# Patient Record
Sex: Female | Born: 1950 | Race: White | Hispanic: No | Marital: Married | Smoking: Current every day smoker
Health system: Southern US, Community
[De-identification: ages and names within clinical notes are randomized; demographics above are authoritative.]

## PROBLEM LIST (undated history)

## (undated) DIAGNOSIS — F32A Depression, unspecified: Secondary | ICD-10-CM

## (undated) DIAGNOSIS — F419 Anxiety disorder, unspecified: Secondary | ICD-10-CM

## (undated) DIAGNOSIS — F410 Panic disorder [episodic paroxysmal anxiety] without agoraphobia: Secondary | ICD-10-CM

## (undated) DIAGNOSIS — F329 Major depressive disorder, single episode, unspecified: Secondary | ICD-10-CM

## (undated) DIAGNOSIS — K219 Gastro-esophageal reflux disease without esophagitis: Secondary | ICD-10-CM

## (undated) DIAGNOSIS — E079 Disorder of thyroid, unspecified: Secondary | ICD-10-CM

## (undated) DIAGNOSIS — I10 Essential (primary) hypertension: Secondary | ICD-10-CM

## (undated) DIAGNOSIS — T7840XA Allergy, unspecified, initial encounter: Secondary | ICD-10-CM

## (undated) HISTORY — DX: Gastro-esophageal reflux disease without esophagitis: K21.9

## (undated) HISTORY — PX: COLONOSCOPY: SHX174

## (undated) HISTORY — PX: CATARACT EXTRACTION, BILATERAL: SHX1313

## (undated) HISTORY — DX: Allergy, unspecified, initial encounter: T78.40XA

## (undated) HISTORY — DX: Panic disorder (episodic paroxysmal anxiety): F41.0

## (undated) HISTORY — DX: Depression, unspecified: F32.A

## (undated) HISTORY — DX: Essential (primary) hypertension: I10

## (undated) HISTORY — DX: Disorder of thyroid, unspecified: E07.9

## (undated) HISTORY — DX: Anxiety disorder, unspecified: F41.9

## (undated) HISTORY — DX: Major depressive disorder, single episode, unspecified: F32.9

---

## 1981-01-06 HISTORY — PX: TUBAL LIGATION: SHX77

## 2004-11-27 ENCOUNTER — Ambulatory Visit: Payer: Self-pay | Admitting: Unknown Physician Specialty

## 2007-04-07 LAB — CONVERTED CEMR LAB: Pap Smear: NORMAL

## 2008-08-30 ENCOUNTER — Ambulatory Visit: Payer: Self-pay | Admitting: Family Medicine

## 2008-08-30 DIAGNOSIS — J309 Allergic rhinitis, unspecified: Secondary | ICD-10-CM | POA: Insufficient documentation

## 2008-08-30 DIAGNOSIS — E039 Hypothyroidism, unspecified: Secondary | ICD-10-CM

## 2008-08-30 DIAGNOSIS — F17200 Nicotine dependence, unspecified, uncomplicated: Secondary | ICD-10-CM

## 2008-09-27 ENCOUNTER — Ambulatory Visit: Payer: Self-pay | Admitting: Family Medicine

## 2008-09-27 LAB — CONVERTED CEMR LAB
BUN: 7 mg/dL (ref 6–23)
Basophils Absolute: 0 10*3/uL (ref 0.0–0.1)
Bilirubin, Direct: 0.1 mg/dL (ref 0.0–0.3)
Chloride: 108 meq/L (ref 96–112)
Creatinine, Ser: 0.7 mg/dL (ref 0.4–1.2)
Eosinophils Absolute: 0.4 10*3/uL (ref 0.0–0.7)
Eosinophils Relative: 6.3 % — ABNORMAL HIGH (ref 0.0–5.0)
HDL: 53.4 mg/dL (ref >39.00)
LDL Cholesterol: 97 mg/dL (ref 0–99)
MCHC: 34.5 g/dL (ref 30.0–36.0)
MCV: 94.8 fL (ref 78.0–100.0)
Monocytes Absolute: 0.4 10*3/uL (ref 0.1–1.0)
Neutrophils Relative %: 57.5 % (ref 43.0–77.0)
Platelets: 228 10*3/uL (ref 150.0–400.0)
RDW: 12.5 % (ref 11.5–14.6)
Total Bilirubin: 1.1 mg/dL (ref 0.3–1.2)
Total CHOL/HDL Ratio: 3
Triglycerides: 115 mg/dL (ref 0.0–149.0)
VLDL: 23 mg/dL (ref 0.0–40.0)
WBC: 6 10*3/uL (ref 4.5–10.5)

## 2008-09-29 ENCOUNTER — Ambulatory Visit: Payer: Self-pay | Admitting: Family Medicine

## 2008-10-16 ENCOUNTER — Ambulatory Visit: Payer: Self-pay | Admitting: Family Medicine

## 2008-10-16 DIAGNOSIS — J0110 Acute frontal sinusitis, unspecified: Secondary | ICD-10-CM | POA: Insufficient documentation

## 2008-10-23 ENCOUNTER — Encounter: Payer: Self-pay | Admitting: Family Medicine

## 2008-11-08 ENCOUNTER — Encounter (INDEPENDENT_AMBULATORY_CARE_PROVIDER_SITE_OTHER): Payer: Self-pay | Admitting: *Deleted

## 2009-02-21 ENCOUNTER — Ambulatory Visit: Payer: Self-pay | Admitting: Family Medicine

## 2009-03-21 ENCOUNTER — Ambulatory Visit: Payer: Self-pay | Admitting: Family Medicine

## 2009-03-21 DIAGNOSIS — R5381 Other malaise: Secondary | ICD-10-CM

## 2009-03-21 DIAGNOSIS — R5383 Other fatigue: Secondary | ICD-10-CM

## 2009-07-26 ENCOUNTER — Telehealth: Payer: Self-pay | Admitting: Family Medicine

## 2010-02-05 NOTE — Progress Notes (Signed)
Summary: synthroid  Phone Note Refill Request Call back at Home Phone [phone] Message from:  Patient on July 26, 2009 10:15 AM  Refills Requested: Medication #1:  SYNTHROID 100 MCG TABS Take 1 tablet by mouth once a day [facility] on [address]. Patient says that she has contacted the pharmacy and they told her they sent [place] the request.  Initial call taken by: Melody Comas,  July 26, 2009 10:14 AM    Prescriptions: SYNTHROID 100 MCG TABS (LEVOTHYROXINE SODIUM) Take 1 tablet by mouth once a day  #30 x 3   Entered by:   Lowella Petties CMA   Authorized by:   Kerby Nora MD   Signed by:   Lowella Petties CMA on 07/26/2009   Method used:   Electronically to        [facility] [phone].* (retail)       [address]       [place], [place]  [address]       Ph: [phone]       Fax: [phone]   RxID:   [id number]

## 2010-02-05 NOTE — Assessment & Plan Note (Signed)
Summary: ROA FOR FOLLOW-UP WELLBUTRIN/JRR   Vital Signs:  Patient profile:   60 year old female Height:      64.75 inches Weight:      162.0 pounds BMI:     27.26 Temp:     98.2 degrees F oral Pulse rate:   86 / minute Pulse rhythm:   regular BP sitting:   120 / 72  (left arm) Cuff size:   regular  Vitals Entered By: Benny Lennert CMA Duncan Dull) (March 21, 2009 2:28 PM)  History of Present Illness: Chief complaint follow up medication  Smoking cessation.Marland Kitchendoing well on Zyban..has cut down to 5 per day. Only using Zyban once daily ..plans to increase to two times a day. Has started walking more..more energetic, less stressed out, less fatigue.  HAs been on for 1 month.  No SI, no HI.    Problems Prior to Update: 1)  Acute Frontal Sinusitis  (ICD-461.1) 2)  Preventive Health Care  (ICD-V70.0) 3)  Special Screening For Osteoporosis  (ICD-V82.81) 4)  Other Screening Mammogram  (ICD-V76.12) 5)  Screening For Lipoid Disorders  (ICD-V77.91) 6)  Tobacco Abuse  (ICD-305.1) 7)  Hypothyroidism  (ICD-244.9) 8)  Allergic Rhinitis  (ICD-477.9)  Current Medications (verified): 1)  Synthroid 100 Mcg Tabs (Levothyroxine Sodium) .... Take 1 Tablet By Mouth Once A Day 2)  Aspirin 81 Mg  Tabs (Aspirin) .... Take 1 Tablet By Mouth Once A Day 3)  Multivitamins   Tabs (Multiple Vitamin) .... Sometimes 4)  Zyrtec Allergy 10 Mg Caps (Cetirizine Hcl) .... Take 1 Capsule By Mouth At Bedtime As Needed 5)  Bupropion Hcl 150 Mg Xr24h-Tab (Bupropion Hcl) .Marland Kitchen.. 1 Tab By Mouth Daily  Allergies (verified): 1)  ! Codeine  Past History:  Past medical, surgical, family and social histories (including risk factors) reviewed, and no changes noted (except as noted below).  Past Medical History: Reviewed history from 08/30/2008 and no changes required. Allergic rhinitis Hypothyroidism  Past Surgical History: Reviewed history from 08/30/2008 and no changes required. Appendectomy BTL 1983  Family  History: Reviewed history from 08/30/2008 and no changes required. father: alcoholism, suicide iage 7 mother: COPD brother: healthy MGM: Mi age 60 PGF: ? cancer type  Social History: Reviewed history from 08/30/2008 and no changes required. 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 CV:  Denies chest pain or discomfort. Resp:  Denies shortness of breath. GI:  Denies abdominal pain, bloody stools, constipation, and diarrhea. Psych:  Denies anxiety and depression.  Physical Exam  General:  Well-developed,well-nourished,in no acute distress; alert,appropriate and cooperative throughout examination Mouth:  Oral mucosa and oropharynx without lesions or exudates.  Teeth in good repair. Lungs:  Normal respiratory effort, chest expands symmetrically. Lungs are clear to auscultation, no crackles or wheezes. Heart:  Normal rate and regular rhythm. S1 and S2 normal without gallop, murmur, click, rub or other extra sounds. Abdomen:  Bowel sounds positive,abdomen soft and non-tender without masses, organomegaly or hernias noted. Pulses:  R and L posterior tibial pulses are full and equal bilaterally  Extremities:  no edema  Psych:  Cognition and judgment appear intact. Alert and cooperative with normal attention span and concentration. No apparent delusions, illusions, hallucinations   Impression & Recommendations:  Problem # 1:  MALAISE AND FATIGUE (ICD-780.79) Improved once  on wellbutrin. Refilled medicaiton.  Exercsiing, working on healthy eating.   Problem # 2:  TOBACCO ABUSE (ICD-305.1) Recommended quiting smoking entirely..she has planned stop date. Refilled medicaiton..with once a day generic as changed by pharmacy.   Complete  Medication List: 1)  Synthroid 100 Mcg Tabs (Levothyroxine sodium) .... Take 1 tablet by mouth once a day 2)  Aspirin 81 Mg Tabs (Aspirin) .... Take 1 tablet by mouth once a day 3)  Multivitamins Tabs (Multiple vitamin) .... Sometimes 4)  Zyrtec Allergy 10 Mg Caps (Cetirizine hcl) .... Take 1 capsule by mouth at bedtime as needed 5)  Bupropion Hcl 150 Mg Xr24h-tab (Bupropion hcl) .Marland Kitchen.. 1 tab by mouth daily  Patient Instructions: 1)  Follow up as needed.  Prescriptions: BUPROPION HCL 150 MG XR24H-TAB (BUPROPION HCL) 1 tab by mouth daily  #30 x 5   Entered and Authorized by:   Kerby Nora MD   Signed by:   Kerby Nora MD on 03/21/2009   Method used:   Electronically to        [facility] [address] [phone].* (retail)       [address]       [place], [place]  [address]       Ph: [phone]       Fax: [phone]   RxID:   [id number]

## 2010-02-05 NOTE — Assessment & Plan Note (Signed)
Summary: SINUS INFECTION/RBH   Vital Signs:  Patient profile:   60 year old female Height:      64.75 inches Weight:      162 pounds BMI:     27.26 Temp:     98.4 degrees F oral Pulse rate:   92 / minute Pulse rhythm:   regular BP sitting:   134 / 74  (left arm) Cuff size:   regular  Vitals Entered By: Delilah Shan CMA Duncan Dull) (February 21, 2009 3:17 PM) CC: ? sinus infection   History of Present Illness: 60 yo here for acute visit to discuss URI symptoms and smoking cessation.  URI symptoms- two weeks of worsening nasal congestion, sinus pressure. Subjective fevers and chills.  Dry cough. No shortness of breath or wheezing. No n/v/d, no rashes. Not taking anything OTC.  Tobacco abuse- ready to quit.  Has a 10 year pack history.  Quit once before with patches, then started back again 4 months later.  Watched her mom die of COPD and does not want that for herself.  Tried Chantix but it made her feel "swimmy headed."  Current Medications (verified): 1)  Synthroid 100 Mcg Tabs (Levothyroxine Sodium) .... Take 1 Tablet By Mouth Once A Day 2)  Aspirin 81 Mg  Tabs (Aspirin) .... Take 1 Tablet By Mouth Once A Day 3)  Multivitamins   Tabs (Multiple Vitamin) .... Sometimes 4)  Zyrtec Allergy 10 Mg Caps (Cetirizine Hcl) .... Take 1 Capsule By Mouth At Bedtime As Needed 5)  Azithromycin 250 Mg  Tabs (Azithromycin) .... 2 By  Mouth Today and Then 1 Daily For 4 Days 6)  Zyban 150 Mg Xr12h-Tab (Bupropion Hcl (Smoking Deter)) .Marland Kitchen.. 1 Tab By Mouth Daily For 3 Days; Increase To 1 Tab By Mouth Twice Daily; Treatment Should Continue For 7-12 Weeks.  Allergies: 1)  ! Codeine  Review of Systems      See HPI General:  Complains of chills and fever. ENT:  Complains of nasal congestion, postnasal drainage, and sinus pressure. CV:  Denies chest pain or discomfort. Resp:  Complains of cough; denies shortness of breath, sputum productive, and wheezing.  Physical Exam  General:   Well-developed,well-nourished,in no acute distress; alert,appropriate and cooperative throughout examination Afebrile and nontoxic. Ears:  TMs retracted bilaterally. Nose:  nasal dischargemucosal pallor.   Mouth:  MMM, no tongue abnormalities and no leukoplakia.   Lungs:  Normal respiratory effort, chest expands symmetrically. Lungs are clear to auscultation, no crackles or wheezes. Heart:  Normal rate and regular rhythm. S1 and S2 normal without gallop, murmur, click, rub or other extra sounds. Extremities:  no edema Cervical Nodes:  No lymphadenopathy noted Psych:  Cognition and judgment appear intact. Alert and cooperative with normal attention span and concentration. No apparent delusions, illusions, hallucinations   Impression & Recommendations:  Problem # 1:  ACUTE FRONTAL SINUSITIS (ICD-461.1) Assessment New Given duration and progression of symptoms, will treat for bacterial sinusitis with Zpack. Continue supportive care as per pt instructions. The following medications were removed from the medication list:    Azithromycin 500 Mg Tabs (Azithromycin) .Marland Kitchen... 1 tab by mouth daily x 3 days. Her updated medication list for this problem includes:    Azithromycin 250 Mg Tabs (Azithromycin) .Marland Kitchen... 2 by  mouth today and then 1 daily for 4 days  Problem # 2:  TOBACCO ABUSE (ICD-305.1) Assessment: Unchanged Time spent with patient 25 minutes, more than 50% of this time was spent counseling patient on smoking cessation. Will try  Zyban with patches as the two in combination have been shown to be as equally efficacious as Chantix.  F/u in one month.  Her updated medication list for this problem includes:    Zyban 150 Mg Xr12h-tab (Bupropion hcl (smoking deter)) .Marland Kitchen... 1 tab by mouth daily for 3 days; increase to 1 tab by mouth twice daily; treatment should continue for 7-12 weeks.  Complete Medication List: 1)  Synthroid 100 Mcg Tabs (Levothyroxine sodium) .... Take 1 tablet by mouth once a  day 2)  Aspirin 81 Mg Tabs (Aspirin) .... Take 1 tablet by mouth once a day 3)  Multivitamins Tabs (Multiple vitamin) .... Sometimes 4)  Zyrtec Allergy 10 Mg Caps (Cetirizine hcl) .... Take 1 capsule by mouth at bedtime as needed 5)  Azithromycin 250 Mg Tabs (Azithromycin) .... 2 by  mouth today and then 1 daily for 4 days 6)  Zyban 150 Mg Xr12h-tab (Bupropion hcl (smoking deter)) .Marland Kitchen.. 1 tab by mouth daily for 3 days; increase to 1 tab by mouth twice daily; treatment should continue for 7-12 weeks.  Patient Instructions: 1)  Take Zpack as directed.  Drink lots of fluids.  Treat sympotmatically with Mucinex, nasal saline irrigation, and Tylenol/Ibuprofen.  You can use warm compresses.  Call if not improving as expected in 5-7 days.  2)  Stop smoking tips: Choose a quit date. Start Zyban (Wellbutrin) at least 1 week before your quit date.   Decide what you will do as a substitute when you feel the urge to smoke(gum, toothpick, exercise).  3)  Please follow up with me in one month. Prescriptions: ZYBAN 150 MG XR12H-TAB (BUPROPION HCL (SMOKING DETER)) 1 tab by mouth daily for 3 days; increase to 1 tab by mouth twice daily; treatment should continue for 7-12 weeks.  #60 x 0   Entered and Authorized by:   Ruthe Mannan MD   Signed by:   Ruthe Mannan MD on 02/21/2009   Method used:   Electronically to        [facility] [address] [phone].* (retail)       [address]       [place], [place]  [address]       Ph: [phone]       Fax: [phone]   RxID:   [id number] AZITHROMYCIN 250 MG  TABS (AZITHROMYCIN) 2 by  mouth today and then 1 daily for 4 days  #6 x 0   Entered and Authorized by:   Ruthe Mannan MD   Signed by:   Ruthe Mannan MD on 02/21/2009   Method used:   Electronically to        [facility] [address] [phone].* (retail)       [address]       [place], [place]  [address]       Ph: [phone]       Fax: [phone]   RxID:    [id number]   Current Allergies (reviewed today): ! CODEINE

## 2010-05-04 ENCOUNTER — Other Ambulatory Visit: Payer: Self-pay | Admitting: Family Medicine

## 2012-04-20 HISTORY — PX: APPENDECTOMY: SHX54

## 2012-06-03 ENCOUNTER — Other Ambulatory Visit: Payer: Self-pay | Admitting: Gastroenterology

## 2012-06-04 LAB — WBCS, STOOL

## 2012-08-15 ENCOUNTER — Emergency Department: Payer: Self-pay | Admitting: Emergency Medicine

## 2012-08-15 LAB — COMPREHENSIVE METABOLIC PANEL
Anion Gap: 8 (ref 7–16)
BUN: 9 mg/dL (ref 7–18)
Bilirubin,Total: 0.6 mg/dL (ref 0.2–1.0)
Calcium, Total: 8.9 mg/dL (ref 8.5–10.1)
Chloride: 99 mmol/L (ref 98–107)
Co2: 27 mmol/L (ref 21–32)
EGFR (African American): >60
EGFR (Non-African Amer.): >60
Glucose: 92 mg/dL (ref 65–99)
Potassium: 3.9 mmol/L (ref 3.5–5.1)
SGOT(AST): 26 U/L (ref 15–37)
SGPT (ALT): 25 U/L (ref 12–78)
Sodium: 134 mmol/L — ABNORMAL LOW (ref 136–145)
Total Protein: 7.6 g/dL (ref 6.4–8.2)

## 2012-08-15 LAB — CBC
HCT: 39.9 % (ref 35.0–47.0)
HGB: 14.1 g/dL (ref 12.0–16.0)
MCH: 33.7 pg (ref 26.0–34.0)
MCHC: 35.4 g/dL (ref 32.0–36.0)
MCV: 95 fL (ref 80–100)
Platelet: 272 10*3/uL (ref 150–440)
RBC: 4.2 10*6/uL (ref 3.80–5.20)

## 2012-08-15 LAB — TSH: Thyroid Stimulating Horm: 0.719 u[IU]/mL

## 2012-08-15 LAB — URINALYSIS, COMPLETE
Bacteria: NONE SEEN
Bilirubin,UR: NEGATIVE
Leukocyte Esterase: NEGATIVE
RBC,UR: <1 /HPF (ref 0–5)

## 2012-08-15 LAB — TROPONIN I: Troponin-I: <0.02 ng/mL

## 2012-08-15 LAB — CK TOTAL AND CKMB (NOT AT ARMC): CK, Total: 109 U/L (ref 21–215)

## 2013-04-30 ENCOUNTER — Emergency Department: Payer: Self-pay | Admitting: Emergency Medicine

## 2013-06-03 ENCOUNTER — Ambulatory Visit: Payer: Self-pay

## 2013-07-06 DIAGNOSIS — G4700 Insomnia, unspecified: Secondary | ICD-10-CM | POA: Insufficient documentation

## 2013-08-18 DIAGNOSIS — K219 Gastro-esophageal reflux disease without esophagitis: Secondary | ICD-10-CM | POA: Insufficient documentation

## 2013-08-19 ENCOUNTER — Ambulatory Visit: Payer: Self-pay | Admitting: Gastroenterology

## 2013-08-31 ENCOUNTER — Encounter: Payer: Self-pay | Admitting: *Deleted

## 2013-09-14 ENCOUNTER — Ambulatory Visit (INDEPENDENT_AMBULATORY_CARE_PROVIDER_SITE_OTHER): Payer: BC Managed Care – PPO | Admitting: General Surgery

## 2013-09-14 ENCOUNTER — Encounter: Payer: Self-pay | Admitting: General Surgery

## 2013-09-14 VITALS — BP 120/70 | HR 76 | Resp 14 | Ht 64.0 in | Wt 161.0 lb

## 2013-09-14 DIAGNOSIS — K819 Cholecystitis, unspecified: Secondary | ICD-10-CM

## 2013-09-14 NOTE — Patient Instructions (Addendum)
Patient to be scheduled for gallbladder surgery. The patient is aware to call back for any questions or concerns.  Laparoscopic Cholecystectomy Laparoscopic cholecystectomy is surgery to remove the gallbladder. The gallbladder is located in the upper right part of the abdomen, behind the liver. It is a storage sac for bile produced in the liver. Bile aids in the digestion and absorption of fats. Cholecystectomy is often done for inflammation of the gallbladder (cholecystitis). This condition is usually caused by a buildup of gallstones (cholelithiasis) in your gallbladder. Gallstones can block the flow of bile, resulting in inflammation and pain. In severe cases, emergency surgery may be required. When emergency surgery is not required, you will have time to prepare for the procedure. Laparoscopic surgery is an alternative to open surgery. Laparoscopic surgery has a shorter recovery time. Your common bile duct may also need to be examined during the procedure. If stones are found in the common bile duct, they may be removed. LET [facility] CARE PROVIDER KNOW ABOUT:  Any allergies you have.  All medicines you are taking, including vitamins, herbs, eye drops, creams, and over-the-counter medicines.  Previous problems you or members of your family have had with the use of anesthetics.  Any blood disorders you have.  Previous surgeries you have had.  Medical conditions you have. RISKS AND COMPLICATIONS Generally, this is a safe procedure. However, as with any procedure, complications can occur. Possible complications include:  Infection.  Damage to the common bile duct, nerves, arteries, veins, or other internal organs such as the stomach, liver, or intestines.  Bleeding.  A stone may remain in the common bile duct.  A bile leak from the cyst duct that is clipped when your gallbladder is removed.  The need to convert to open surgery, which requires a larger incision in the abdomen. This  may be necessary if your surgeon thinks it is not safe to continue with a laparoscopic procedure. BEFORE THE PROCEDURE  Ask your health care provider about changing or stopping any regular medicines. You will need to stop taking aspirin or blood thinners at least 5 days prior to surgery.  Do not eat or drink anything after midnight the night before surgery.  Let your health care provider know if you develop a cold or other infectious problem before surgery. PROCEDURE   You will be given medicine to make you sleep through the procedure (general anesthetic). A breathing tube will be placed in your mouth.  When you are asleep, your surgeon will make several small cuts (incisions) in your abdomen.  A thin, lighted tube with a tiny camera on the end (laparoscope) is inserted through one of the small incisions. The camera on the laparoscope sends a picture to a TV screen in the operating room. This gives the surgeon a good view inside your abdomen.  A gas will be pumped into your abdomen. This expands your abdomen so that the surgeon has more room to perform the surgery.  Other tools needed for the procedure are inserted through the other incisions. The gallbladder is removed through one of the incisions.  After the removal of your gallbladder, the incisions will be closed with stitches, staples, or skin glue. AFTER THE PROCEDURE  You will be taken to a recovery area where your progress will be checked often.  You may be allowed to go home the same day if your pain is controlled and you can tolerate liquids. Document Released: 12/23/2004 Document Revised: 10/13/2012 Document Reviewed: 08/04/2012 ExitCare Patient Information  2015 [employer]. This information is not intended to replace advice given to you by your health care provider. Make sure you discuss any questions you have with your health care provider.  Patient wishes to check work schedule and will call the office back to arrange a  date for surgery.

## 2013-09-14 NOTE — Progress Notes (Signed)
Patient ID: Jade Green, female   DOB: 10/16/1950, 63 y.o.   MRN: [id number]  Chief Complaint  Patient presents with  . Other    gallbladder    HPI Jade Green is a 63 y.o. female who presents for abdominal pain that has been present for approximately 6 months. The pain is located right underneath her sternum. She describes the pain as sharp stabbing pain. The pain lasts for approximately 15 minutes at a time. She has had nausea and diarrhea. She notices the pain mainly after eating. She has difficulty with eating greasy foods and salads. She also complains of belching. She uses Tums to help with the belching that seems to help. The patient had an abdominal ultrasound and HIDA Scan performed at [facility] on 08/19/13. She experiences these episodes on an every other day basis. The episodes have become more frequent.   HPIHe  Past Medical History  Diagnosis Date  . GERD (gastroesophageal reflux disease)   . Thyroid disease   . Anxiety   . Panic attack     Past Surgical History  Procedure Laterality Date  . Tubal ligation  1983  . Colonoscopy  4098,1191  . Appendectomy  April 20, 2012    acute suppurative appendicitis. And a    Family History  Problem Relation Age of Onset  . COPD Mother     Social History History  Substance Use Topics  . Smoking status: Current Every Day Smoker -- 1.00 packs/day for 20 years  . Smokeless tobacco: Never Used  . Alcohol Use: Yes    Allergies  Allergen Reactions  . Amoxicillin-Pot Clavulanate Nausea And Vomiting  . Citalopram Diarrhea  . Codeine     REACTION: Nausea  . Other Diarrhea    Current Outpatient Prescriptions  Medication Sig Dispense Refill  . buPROPion (BUDEPRION XL) 300 MG 24 hr tablet Take 300 mg by mouth daily.       . cetirizine (ZYRTEC) 10 MG tablet Take 10 mg by mouth daily.       . clonazePAM (KLONOPIN) 0.5 MG tablet Take 0.5 mg by mouth as needed for anxiety.      . Cyanocobalamin (RA VITAMIN B-12 TR) 1000 MCG  TBCR Take 1 tablet by mouth daily.       . Multiple Vitamin (MULTIVITAMIN) capsule Take 1 capsule by mouth daily.       . pantoprazole (PROTONIX) 40 MG tablet Take 40 mg by mouth daily.       Marland Kitchen SYNTHROID 100 MCG tablet take 1 tablet by mouth once daily  30 tablet  1   No current facility-administered medications for this visit.    Review of Systems Review of Systems  Constitutional: Negative.   Respiratory: Negative.   Cardiovascular: Negative.   Gastrointestinal: Positive for nausea, abdominal pain and diarrhea.    Blood pressure 120/70, pulse 76, resp. rate 14, height  (1.626 m), weight 161 lb (73.029 kg).  Physical Exam Physical Exam  Constitutional: She is oriented to person, place, and time. She appears well-developed and well-nourished.  Cardiovascular: Normal rate, regular rhythm and normal heart sounds.   No murmur heard. Pulmonary/Chest: Effort normal and breath sounds normal.  Abdominal: Soft. Normal appearance and bowel sounds are normal. There is no hepatosplenomegaly. There is no tenderness. No hernia.  Neurological: She is alert and oriented to person, place, and time.  Skin: Skin is warm and dry.    Data Reviewed GI nose from Jade Alamo, NP dated August 31, 2013  were reviewed.  Abdominal ultrasound dated August 19, 2013 did not show any evidence of acute cholecystitis. No gallstones. No pericholecystic fluid or gallbladder wall thickening. Common bile duct measured 5 mm.  HIDA scan with CCK dated August 19, 2013 showed a decreased ejection fraction at 26%. While the formal note reports that the patient experienced no symptoms, the patient reports that she experienced similar symptoms to what she has noted the last several months, primarily epigastric/right upper quadrant discomfort.  Assessment    Symptomatic cholecystitis.     Plan    The patient reports increasing symptoms and frequency over the past 6 months. Her reports of postprandial and  nocturnal pain are strongly suggestive of biliary colic. The absence of stones but the reproduction of symptoms by her report during CCK injection suggests a cholecystectomy has a 75-80% chance of relieving her symptoms.  The risks associated with the surgery were reviewed in detail including the possibility of an open procedure.  Arrangements Had been made for surgery on October 06, 2013.  Patient wishes to check work schedule and will call the office back to arrange a date for surgery.    PCP: Vonita Moss Ref MD: Dr. Caprice Renshaw, Merrily Pew 09/15/2013, 7:33 PM

## 2013-09-15 ENCOUNTER — Other Ambulatory Visit: Payer: Self-pay | Admitting: General Surgery

## 2013-09-15 ENCOUNTER — Encounter: Payer: Self-pay | Admitting: General Surgery

## 2013-09-15 ENCOUNTER — Encounter: Payer: Self-pay | Admitting: *Deleted

## 2013-09-15 DIAGNOSIS — K819 Cholecystitis, unspecified: Secondary | ICD-10-CM

## 2013-09-15 NOTE — Progress Notes (Signed)
Patient ID: Jade Green, female   DOB: 04-Sep-1950, 63 y.o.   MRN: [id number]  Patient's surgery has been scheduled for 10-06-13 at [facility]. This is per patient's request to wait till after scheduled beach trip.  This patient was instructed to call the office should she have further questions.

## 2013-09-29 ENCOUNTER — Telehealth: Payer: Self-pay | Admitting: *Deleted

## 2013-09-29 NOTE — Telephone Encounter (Signed)
Pt called and wanted to talk to you about her surgery that's coming up in October, she has been doing good that past 2-3 weeks and she feels the need she doesn't need surgery. She just wanted to talk to you personally about this so she can decide on what to do.

## 2013-09-29 NOTE — Telephone Encounter (Signed)
Patient reports being asymptomatic for 2-3 weeks.   Will defer surgery at this time.  She was encouraged to call if she has recurrent symptoms.

## 2013-10-05 ENCOUNTER — Emergency Department: Payer: Self-pay | Admitting: Emergency Medicine

## 2013-11-07 ENCOUNTER — Encounter: Payer: Self-pay | Admitting: General Surgery

## 2013-11-09 ENCOUNTER — Ambulatory Visit: Payer: Self-pay | Admitting: Neurology

## 2014-01-12 ENCOUNTER — Encounter: Payer: Self-pay | Admitting: General Surgery

## 2014-06-19 ENCOUNTER — Other Ambulatory Visit: Payer: Self-pay | Admitting: Unknown Physician Specialty

## 2014-06-19 NOTE — Telephone Encounter (Signed)
Needs seen for further refills

## 2014-07-19 ENCOUNTER — Telehealth: Payer: Self-pay | Admitting: Unknown Physician Specialty

## 2014-07-19 NOTE — Telephone Encounter (Signed)
Pt requests a call back from Sheridanheryl. Patient informed Elnita MaxwellCheryl is out of town until Friday. Pt would like a refill on Clonazepam just enough to last until her CPE on 08/09/14. Pharm is [facility] on [address]. Thanks.

## 2014-07-19 NOTE — Telephone Encounter (Signed)
Routing to provider. Pharmacy is [facility] on [address].

## 2014-07-20 MED ORDER — CLONAZEPAM 0.5 MG PO TABS: 0.5000 mg | ORAL_TABLET | ORAL | Status: DC | PRN

## 2014-07-20 NOTE — Telephone Encounter (Signed)
I filled rx.  Controlled substance and it will need to be picked up.

## 2014-07-25 ENCOUNTER — Other Ambulatory Visit: Payer: Self-pay | Admitting: Unknown Physician Specialty

## 2014-07-25 ENCOUNTER — Telehealth: Payer: Self-pay

## 2014-07-25 NOTE — Telephone Encounter (Signed)
Called and let patient know that prescription was ready to be picked up at the front office.

## 2014-07-25 NOTE — Telephone Encounter (Signed)
Patient called stating that she has a appointment scheduled with Elnita Maxwellheryl on 08/09/14 and will run out of her clonzepam before then. Patient wants to know if she can get enough to get her to her appointment. Practice partner number is [phone] and her pharmacy is [facility] on [address].

## 2014-07-25 NOTE — Telephone Encounter (Signed)
This was written on 07/10/14

## 2014-07-26 ENCOUNTER — Other Ambulatory Visit: Payer: Self-pay | Admitting: Unknown Physician Specialty

## 2014-07-26 NOTE — Telephone Encounter (Signed)
Needs seen

## 2014-08-09 ENCOUNTER — Ambulatory Visit (INDEPENDENT_AMBULATORY_CARE_PROVIDER_SITE_OTHER): Payer: BLUE CROSS/BLUE SHIELD | Admitting: Unknown Physician Specialty

## 2014-08-09 ENCOUNTER — Encounter: Payer: Self-pay | Admitting: Unknown Physician Specialty

## 2014-08-09 VITALS — BP 146/84 | HR 84 | Temp 98.3°F | Ht 64.7 in | Wt 138.2 lb

## 2014-08-09 DIAGNOSIS — R030 Elevated blood-pressure reading, without diagnosis of hypertension: Secondary | ICD-10-CM

## 2014-08-09 DIAGNOSIS — F410 Panic disorder [episodic paroxysmal anxiety] without agoraphobia: Secondary | ICD-10-CM | POA: Insufficient documentation

## 2014-08-09 DIAGNOSIS — F419 Anxiety disorder, unspecified: Secondary | ICD-10-CM | POA: Insufficient documentation

## 2014-08-09 DIAGNOSIS — E039 Hypothyroidism, unspecified: Secondary | ICD-10-CM | POA: Diagnosis not present

## 2014-08-09 DIAGNOSIS — K219 Gastro-esophageal reflux disease without esophagitis: Secondary | ICD-10-CM | POA: Insufficient documentation

## 2014-08-09 DIAGNOSIS — G629 Polyneuropathy, unspecified: Secondary | ICD-10-CM | POA: Insufficient documentation

## 2014-08-09 DIAGNOSIS — IMO0001 Reserved for inherently not codable concepts without codable children: Secondary | ICD-10-CM | POA: Insufficient documentation

## 2014-08-09 DIAGNOSIS — E079 Disorder of thyroid, unspecified: Secondary | ICD-10-CM | POA: Insufficient documentation

## 2014-08-09 DIAGNOSIS — Z0000 Encounter for general adult medical examination without abnormal findings: Secondary | ICD-10-CM | POA: Diagnosis not present

## 2014-08-09 DIAGNOSIS — F489 Nonpsychotic mental disorder, unspecified: Secondary | ICD-10-CM | POA: Insufficient documentation

## 2014-08-09 DIAGNOSIS — I10 Essential (primary) hypertension: Secondary | ICD-10-CM

## 2014-08-09 DIAGNOSIS — Z8709 Personal history of other diseases of the respiratory system: Secondary | ICD-10-CM | POA: Insufficient documentation

## 2014-08-09 MED ORDER — CLONAZEPAM 0.5 MG PO TABS: 0.5000 mg | ORAL_TABLET | ORAL | Status: DC | PRN

## 2014-08-09 MED ORDER — BUPROPION HCL ER (XL) 300 MG PO TB24: 300.0000 mg | ORAL_TABLET | Freq: Every day | ORAL | Status: DC

## 2014-08-09 MED ORDER — LEVOTHYROXINE SODIUM 112 MCG PO TABS: 112.0000 ug | ORAL_TABLET | Freq: Every day | ORAL | Status: DC

## 2014-08-09 MED ORDER — PANTOPRAZOLE SODIUM 40 MG PO TBEC: 40.0000 mg | DELAYED_RELEASE_TABLET | Freq: Every day | ORAL | Status: DC

## 2014-08-09 NOTE — Progress Notes (Signed)
BP 146/84 mmHg  Pulse 84  Temp(Src) 98.3 F (36.8 C)  Ht 5' 4.7" (1.643 m)  Wt 138 lb 3.2 oz (62.687 kg)  BMI 23.22 kg/m2  SpO2 99%  LMP  (LMP Unknown)   Subjective:    Patient ID: Felicity Coyer, female    DOB: January 13, 1950, 64 y.o.   MRN: [id number]  HPI: AIMA MCWHIRT is a 64 y.o. female  Chief Complaint  Patient presents with  . Annual Exam   Depression Taking Buproprion daily.  She is under a lot of stress at this time.  PHQ 3 is 2.  She is taking Clonazepam only on occasion.  Used #60 in a year.    hypothyroid Has lots of energy when takes Vitamin B12 and Vitamin D.    GERD Stable on Pantoprazole.  Encouraged to wean off.    Hypertension A little high today.  Outside the office it is below 120/80  Relevant past medical, surgical, family and social history reviewed and updated as indicated. Interim medical history since our last visit reviewed. Allergies and medications reviewed and updated.  Review of Systems  Constitutional: Negative.   HENT: Negative.   Eyes: Negative.   Respiratory: Negative.   Cardiovascular: Negative.   Gastrointestinal: Negative.   Endocrine: Negative.   Genitourinary: Negative.   Musculoskeletal: Negative.   Skin: Negative.   Allergic/Immunologic: Negative.   Neurological: Negative.   Hematological: Negative.   Psychiatric/Behavioral: Negative.   All other systems reviewed and are negative.   Per HPI unless specifically indicated above     Objective:    BP 146/84 mmHg  Pulse 84  Temp(Src) 98.3 F (36.8 C)  Ht 5' 4.7" (1.643 m)  Wt 138 lb 3.2 oz (62.687 kg)  BMI 23.22 kg/m2  SpO2 99%  LMP  (LMP Unknown)  Wt Readings from Last 3 Encounters:  08/09/14 138 lb 3.2 oz (62.687 kg)  08/17/13 135 lb (61.236 kg)  09/14/13 161 lb (73.029 kg)    Physical Exam  Constitutional: She is oriented to person, place, and time. She appears well-developed and well-nourished.  HENT:  Head: Normocephalic and atraumatic.  Eyes:  Pupils are equal, round, and reactive to light. Right eye exhibits no discharge. Left eye exhibits no discharge. No scleral icterus.  Neck: Normal range of motion. Neck supple. Carotid bruit is not present. No thyromegaly present.  Cardiovascular: Normal rate, regular rhythm and normal heart sounds.  Exam reveals no gallop and no friction rub.   No murmur heard. Pulmonary/Chest: Effort normal and breath sounds normal. No respiratory distress. She has no wheezes. She has no rales.  Abdominal: Soft. Bowel sounds are normal. There is no tenderness. There is no rebound.  Genitourinary: No breast swelling, tenderness or discharge.  Musculoskeletal: Normal range of motion.  Lymphadenopathy:    She has no cervical adenopathy.  Neurological: She is alert and oriented to person, place, and time.  Skin: Skin is warm, dry and intact. No rash noted.  Psychiatric: She has a normal mood and affect. Her speech is normal and behavior is normal. Judgment and thought content normal. Cognition and memory are normal.  Nursing note and vitals reviewed.  Assessment & Plan:   Problem List Items Addressed This Visit      Unprioritized   Hypothyroidism   Relevant Medications   levothyroxine (SYNTHROID) 112 MCG tablet   Other Relevant Orders   TSH   Anxiety - Primary   Relevant Medications   buPROPion (BUDEPRION XL) 300 MG 24  hr tablet   GERD (gastroesophageal reflux disease)   Relevant Medications   pantoprazole (PROTONIX) 40 MG tablet   White coat hypertension    Other Visit Diagnoses    Routine general medical examination at a health care facility        Relevant Orders    CBC with Differential/Platelet    Comprehensive metabolic panel    HIV antibody    Lipid Panel w/o Chol/HDL Ratio    MM DIGITAL SCREENING BILATERAL    DG Bone Density     Diagnosis stable.  Continue present medications.     Follow up plan: Return in about 6 months (around 02/09/2015).

## 2014-08-09 NOTE — Patient Instructions (Signed)
Think you're too busy to work out? We have the workout for you. In minutes, high-intensity interval training (H.I.I.T.) will have you sweating, breathing hard and maximizing the health benefits of exercise without the time commitment. Best of all, it's scientifically proven to work.  What Is H.I.I.T.? SHORT WORKOUTS 101 High-intensity interval training - referred to as H.I.I.T. - is based on the idea that short bursts of strenuous exercise can have a big impact on the body. If moderate exercise - like a 20-minute jog - is good for your heart, lungs and metabolism, H.I.I.T. packs the benefits of that workout and more into a few minutes. It may sound too good to be true, but learning this exercise technique and adapting it to your life can mean saving hours at the gym. If you think you don't have time to exercise, H.I.I.T. may be the workout for you.  You can try it with any aerobic activity you like. The principles of H.I.I.T. can be applied to running, biking, stair climbing, swimming, jumping rope, rowing, even hopping or skipping. (Yes, skipping!)  The downside? Even though H.I.I.T. lasts only minutes, the workouts are tough, requiring you to push your body near its limit.  HOW INTENSE IS HIGH INTENSITY? High-intensity exercise is obviously not a casual stroll down the street, but it's not a run-till-your-lungs-pop explosion, either. Think breathless, not winded. Heart-pounding, not exploding. Legs pumping, but not uncontrolled.  You don't need any fancy heart rate monitors to do these workouts. Use cues from your body as a guide. In the middle of a high-intensity workout you should be able to say single words, but not complete whole sentences. So, if you can keep chatting to your workout partner during this workout, pump it up a few notches.  10-25-28 Training This simple program will help you make the most of a short workout by improving heart health and endurance. Try it with your favorite  cardiovascular activity. The essentials of 10-25-28 training are simple. Run, ride or perhaps row on a rowing machine gently for 30 seconds, accelerate to a moderate pace for 20 seconds, then sprint as hard as you can for 10 seconds. (It should be called 30-20-10 training, obviously, but that is not as catchy.) Repeat.  You don't even need a stopwatch to monitor the 30-, 20-, and 10-second time changes. You can just count to yourself, which seems to make the intervals pass more quickly.  Best of all? The grueling, all-out portion of the workout lasts for only 10 seconds. C'mon, you can do anything for 10 seconds, right?  Got 10 Minutes? A solitary minute of hard work buried in 10 minutes of activity can make a big difference.  The 10-Minute Workout If you like to run, bike, row or swim - just a little bit - this workout is a great option for you. Step 1 Warm up for 2 minutes Step 2 Pedal, run or swim all-out for 20 seconds. Repeat 2 more times Warm down for 3 minutes    GET STARTED To benefit the most from really, really short workouts, you need to build the habit of doing them into your hectic life. Ideally, you'll complete the workout three times a week. The best way to build that habit is to start small and be willing to tweak your schedule where you can to accommodate your new workout.  First set up a spot in your house for your workout, equipped with whatever you need to get the job done: sneakers, a   chair, a towel, etc. Then slot your workout in before you would normally shower. (You can even do it in the bathroom.) Or wake up five minutes earlier and do it first thing in the morning, so you can head off to work feeling accomplished. Or do it during your lunch hour. Run up your office's stairs or grab a private conference room for just a few minutes. Or work it into your commute. If you walk or bike to work, add some heavy intervals on the way home.  GET A BOOST FROM MUSIC Creating a  workout playlist of high-energy tunes you love will not make your workout feel easier, but it may cause you to exercise harder without even realizing it. Best of all, if you are doing a really short workout, you need only one or two great tunes to get you through. If you are willing to try something a bit different, make your own music as you exercise. Sing, hum, clap your hands, whatever you can do to jam along to your playlist. It may give you an extra boost to finish strong.  Find a song or podcast that's the length of your really, really short workout. By the time the song is over, you're done.  Excerpted from the [employer] Times Well column http://www.nytimes.com/well/guides/really-really-short-workouts?smid=fb-nytwell&smtyp=pay  

## 2014-08-10 ENCOUNTER — Encounter: Payer: Self-pay | Admitting: Unknown Physician Specialty

## 2014-08-10 LAB — HIV ANTIBODY (ROUTINE TESTING W REFLEX): HIV Screen 4th Generation wRfx: NONREACTIVE

## 2014-08-10 LAB — CBC WITH DIFFERENTIAL/PLATELET
BASOS ABS: 0 10*3/uL (ref 0.0–0.2)
Basos: 0 %
EOS (ABSOLUTE): 0.4 10*3/uL (ref 0.0–0.4)
EOS: 5 %
HEMOGLOBIN: 13.6 g/dL (ref 11.1–15.9)
Hematocrit: 40.1 % (ref 34.0–46.6)
IMMATURE GRANS (ABS): 0 10*3/uL (ref 0.0–0.1)
Immature Granulocytes: 0 %
LYMPHS: 29 %
Lymphocytes Absolute: 2 10*3/uL (ref 0.7–3.1)
MCH: 32.5 pg (ref 26.6–33.0)
MCHC: 33.9 g/dL (ref 31.5–35.7)
MCV: 96 fL (ref 79–97)
MONOCYTES: 7 %
Monocytes Absolute: 0.5 10*3/uL (ref 0.1–0.9)
NEUTROS ABS: 4 10*3/uL (ref 1.4–7.0)
Neutrophils: 59 %
Platelets: 272 10*3/uL (ref 150–379)
RBC: 4.19 x10E6/uL (ref 3.77–5.28)
RDW: 13.3 % (ref 12.3–15.4)
WBC: 6.9 10*3/uL (ref 3.4–10.8)

## 2014-08-10 LAB — COMPREHENSIVE METABOLIC PANEL
ALBUMIN: 4.5 g/dL (ref 3.6–4.8)
ALT: 19 IU/L (ref 0–32)
AST: 23 IU/L (ref 0–40)
Albumin/Globulin Ratio: 1.9 (ref 1.1–2.5)
Alkaline Phosphatase: 104 IU/L (ref 39–117)
BILIRUBIN TOTAL: 0.5 mg/dL (ref 0.0–1.2)
BUN/Creatinine Ratio: 9 — ABNORMAL LOW (ref 11–26)
BUN: 9 mg/dL (ref 8–27)
CALCIUM: 9.2 mg/dL (ref 8.7–10.3)
CO2: 24 mmol/L (ref 18–29)
Chloride: 97 mmol/L (ref 97–108)
Creatinine, Ser: 0.95 mg/dL (ref 0.57–1.00)
GFR calc Af Amer: 73 mL/min/{1.73_m2} (ref >59)
GFR, EST NON AFRICAN AMERICAN: 63 mL/min/{1.73_m2} (ref >59)
Globulin, Total: 2.4 g/dL (ref 1.5–4.5)
Glucose: 111 mg/dL — ABNORMAL HIGH (ref 65–99)
Potassium: 4.6 mmol/L (ref 3.5–5.2)
Sodium: 140 mmol/L (ref 134–144)
TOTAL PROTEIN: 6.9 g/dL (ref 6.0–8.5)

## 2014-08-10 LAB — TSH: TSH: 0.506 u[IU]/mL (ref 0.450–4.500)

## 2014-08-10 LAB — LIPID PANEL W/O CHOL/HDL RATIO
Cholesterol, Total: 167 mg/dL (ref 100–199)
HDL: 81 mg/dL (ref >39)
LDL CALC: 54 mg/dL (ref 0–99)
Triglycerides: 159 mg/dL — ABNORMAL HIGH (ref 0–149)
VLDL Cholesterol Cal: 32 mg/dL (ref 5–40)

## 2014-11-03 ENCOUNTER — Encounter: Payer: Self-pay | Admitting: Unknown Physician Specialty

## 2014-11-03 ENCOUNTER — Ambulatory Visit (INDEPENDENT_AMBULATORY_CARE_PROVIDER_SITE_OTHER): Payer: BLUE CROSS/BLUE SHIELD | Admitting: Unknown Physician Specialty

## 2014-11-03 VITALS — BP 143/82 | HR 78 | Temp 98.4°F | Ht 65.1 in | Wt 138.2 lb

## 2014-11-03 DIAGNOSIS — R2232 Localized swelling, mass and lump, left upper limb: Secondary | ICD-10-CM

## 2014-11-03 DIAGNOSIS — W5501XA Bitten by cat, initial encounter: Secondary | ICD-10-CM

## 2014-11-03 MED ORDER — DOXYCYCLINE HYCLATE 100 MG PO TABS: 100.0000 mg | ORAL_TABLET | Freq: Two times a day (BID) | ORAL | Status: DC

## 2014-11-03 NOTE — Progress Notes (Signed)
   BP 143/82 mmHg  Pulse 78  Temp(Src) 98.4 F (36.9 C)  Ht 5' 5.1" (1.654 m)  Wt 138 lb 3.2 oz (62.687 kg)  BMI 22.91 kg/m2  SpO2 99%  LMP  (LMP Unknown)   Subjective:    Patient ID: Jade Green, female    DOB: 1950/01/31, 64 y.o.   MRN: [id number]  HPI: Jade CoyerDelores B Green is a 64 y.o. female  Chief Complaint  Patient presents with  . Animal Bite    pt states she got bit by her friends cat Wednesday night. States cat is up to date on his shots.   Left hand Pt with left hand swelling.  No fever, minimal pain but there is some swelling.    Relevant past medical, surgical, family and social history reviewed and updated as indicated. Interim medical history since our last visit reviewed. Allergies and medications reviewed and updated.  Review of Systems  All other systems reviewed and are negative.   Per HPI unless specifically indicated above     Objective:    BP 143/82 mmHg  Pulse 78  Temp(Src) 98.4 F (36.9 C)  Ht 5' 5.1" (1.654 m)  Wt 138 lb 3.2 oz (62.687 kg)  BMI 22.91 kg/m2  SpO2 99%  LMP  (LMP Unknown)  Wt Readings from Last 3 Encounters:  11/03/14 138 lb 3.2 oz (62.687 kg)  08/09/14 138 lb 3.2 oz (62.687 kg)  08/17/13 135 lb (61.236 kg)    Physical Exam  Constitutional: She is oriented to person, place, and time. She appears well-developed and well-nourished. No distress.  HENT:  Head: Normocephalic and atraumatic.  Eyes: Conjunctivae and lids are normal. Right eye exhibits no discharge. Left eye exhibits no discharge. No scleral icterus.  Cardiovascular: Normal rate, regular rhythm and normal heart sounds.   Pulmonary/Chest: Effort normal and breath sounds normal. No respiratory distress.  Abdominal: Normal appearance. There is no splenomegaly or hepatomegaly.  Musculoskeletal: Normal range of motion.  Neurological: She is alert and oriented to person, place, and time.  Skin: Skin is intact. No rash noted. No pallor.  Psychiatric: She has a  normal mood and affect. Her behavior is normal. Judgment and thought content normal.  Vitals reviewed.   Assessment & Plan:   Problem List Items Addressed This Visit    None    Visit Diagnoses    Cat bite, initial encounter    -  Primary    Allergic to Augmentin.  Will rx doxycycline 100 mg BID        Follow up plan: Return if symptoms worsen or fail to improve.

## 2015-02-13 ENCOUNTER — Other Ambulatory Visit: Payer: Self-pay | Admitting: Unknown Physician Specialty

## 2015-06-06 LAB — HM DIABETES EYE EXAM

## 2015-08-13 ENCOUNTER — Other Ambulatory Visit: Payer: Self-pay | Admitting: Unknown Physician Specialty

## 2015-08-13 NOTE — Telephone Encounter (Signed)
Needs an appointment with Elnita Maxwellheryl. Will get her enough medicine to make it to appointment when it's booked.

## 2015-08-13 NOTE — Telephone Encounter (Signed)
Called and left patient a voicemail asking for her to please return my call.  

## 2015-08-14 NOTE — Telephone Encounter (Signed)
Pt called and scheduled an appt for 08/31/15 @ 3:30pm. Please send medications to [facility] [place], [place]. Thanks.   [facility] phone [facility] [phone]

## 2015-08-14 NOTE — Telephone Encounter (Signed)
Called and left patient a voicemail asking for her to please return my call to schedule a follow-up visit. 

## 2015-08-14 NOTE — Telephone Encounter (Signed)
Routing back to provider for medication approval.

## 2015-08-31 ENCOUNTER — Encounter: Payer: Self-pay | Admitting: Unknown Physician Specialty

## 2015-08-31 ENCOUNTER — Ambulatory Visit (INDEPENDENT_AMBULATORY_CARE_PROVIDER_SITE_OTHER): Payer: Medicare Other | Admitting: Unknown Physician Specialty

## 2015-08-31 DIAGNOSIS — I10 Essential (primary) hypertension: Secondary | ICD-10-CM

## 2015-08-31 DIAGNOSIS — E039 Hypothyroidism, unspecified: Secondary | ICD-10-CM | POA: Diagnosis not present

## 2015-08-31 DIAGNOSIS — IMO0001 Reserved for inherently not codable concepts without codable children: Secondary | ICD-10-CM

## 2015-08-31 DIAGNOSIS — R030 Elevated blood-pressure reading, without diagnosis of hypertension: Secondary | ICD-10-CM

## 2015-08-31 DIAGNOSIS — F17200 Nicotine dependence, unspecified, uncomplicated: Secondary | ICD-10-CM | POA: Diagnosis not present

## 2015-08-31 MED ORDER — VARENICLINE TARTRATE 0.5 MG PO TABS: 0.5000 mg | ORAL_TABLET | Freq: Two times a day (BID) | ORAL | 6 refills | Status: AC

## 2015-08-31 MED ORDER — BUPROPION HCL ER (XL) 300 MG PO TB24: 300.0000 mg | ORAL_TABLET | Freq: Every day | ORAL | 3 refills | Status: AC

## 2015-08-31 MED ORDER — CLONAZEPAM 0.5 MG PO TABS: 0.5000 mg | ORAL_TABLET | ORAL | 0 refills | Status: DC | PRN

## 2015-08-31 MED ORDER — VARENICLINE TARTRATE 1 MG PO TABS: 1.0000 mg | ORAL_TABLET | Freq: Two times a day (BID) | ORAL | 5 refills | Status: AC

## 2015-08-31 MED ORDER — LEVOTHYROXINE SODIUM 112 MCG PO TABS: 112.0000 ug | ORAL_TABLET | Freq: Every day | ORAL | 3 refills | Status: AC

## 2015-08-31 NOTE — Assessment & Plan Note (Signed)
Pt with well known white coat hypertension.  Recommended to get own BP cuff and start recording readings

## 2015-08-31 NOTE — Assessment & Plan Note (Signed)
Good results outside the office

## 2015-08-31 NOTE — Progress Notes (Signed)
BP (!) 160/87 (BP Location: Left Arm, Patient Position: Sitting, Cuff Size: Normal)   Pulse 71   Temp 98.2 F (36.8 C)   Ht 5' 4.3" (1.633 m)   Wt 141 lb 9.6 oz (64.2 kg)   LMP  (LMP Unknown)   SpO2 98%   BMI 24.08 kg/m    Subjective:    Patient ID: Jade Green, female    DOB: [birth year], 65 y.o.   MRN: [id number]  HPI: Jade Green is a 65 y.o. female  Chief Complaint  Patient presents with  . Depression  . Hypothyroidism  . Gastroesophageal Reflux   Depression Taking Buproprion daily. She is taking Clonazepam only on occasion.  Used #60 in a year.  Depression screen [facility] 2/9 08/31/2015 08/09/2014 08/09/2014  Decreased Interest 0 1 0  Down, Depressed, Hopeless 0 1 0  PHQ - 2 Score 0 2 0    Hypothyroid Has lots of energy when takes Vitamin B12 and Vitamin D.    GERD Stopped Pantoprazole  Hypertension High today.  States she goes to [employer] and runs 118-130  Tobacco use Would like to quit smoking  Relevant past medical, surgical, family and social history reviewed and updated as indicated. Interim medical history since our last visit reviewed. Allergies and medications reviewed and updated.  Review of Systems  Per HPI unless specifically indicated above     Objective:    BP (!) 160/87 (BP Location: Left Arm, Patient Position: Sitting, Cuff Size: Normal)   Pulse 71   Temp 98.2 F (36.8 C)   Ht 5' 4.3" (1.633 m)   Wt 141 lb 9.6 oz (64.2 kg)   LMP  (LMP Unknown)   SpO2 98%   BMI 24.08 kg/m   Wt Readings from Last 3 Encounters:  08/31/15 141 lb 9.6 oz (64.2 kg)  11/03/14 138 lb 3.2 oz (62.7 kg)  08/09/14 138 lb 3.2 oz (62.7 kg)    Physical Exam  Constitutional: She is oriented to person, place, and time. She appears well-developed and well-nourished. No distress.  HENT:  Head: Normocephalic and atraumatic.  Eyes: Conjunctivae and lids are normal. Right eye exhibits no discharge. Left eye exhibits no discharge. No scleral icterus.  Neck:  Normal range of motion. Neck supple. No JVD present. Carotid bruit is not present.  Cardiovascular: Normal rate, regular rhythm and normal heart sounds.   Pulmonary/Chest: Effort normal and breath sounds normal.  Abdominal: Normal appearance. There is no splenomegaly or hepatomegaly.  Musculoskeletal: Normal range of motion.  Neurological: She is alert and oriented to person, place, and time.  Skin: Skin is warm, dry and intact. No rash noted. No pallor.  Psychiatric: She has a normal mood and affect. Her behavior is normal. Judgment and thought content normal.    Results for orders placed or performed in visit on 06/07/15  HM DIABETES EYE EXAM  Result Value Ref Range   HM Diabetic Eye Exam Retinopathy (A) No Retinopathy      Assessment & Plan:   Problem List Items Addressed This Visit      Unprioritized   Hypertension    Pt with well known white coat hypertension.  Recommended to get own BP cuff and start recording readings      Relevant Orders   Comprehensive metabolic panel   Hypothyroidism    Stable, continue present medications.        Relevant Medications   levothyroxine (SYNTHROID) 112 MCG tablet   Other Relevant Orders  TSH   TOBACCO ABUSE    Wants to try Chantix again.       Relevant Medications   varenicline (CHANTIX) 0.5 MG tablet   varenicline (CHANTIX CONTINUING MONTH PAK) 1 MG tablet   White coat hypertension    Good results outside the office       Other Visit Diagnoses   None.     She needs PE with health maintenance due.  She would like to try to find a provider closer to home.  Follow up plan: Return if symptoms worsen or fail to improve.

## 2015-08-31 NOTE — Assessment & Plan Note (Signed)
Stable, continue present medications.   

## 2015-08-31 NOTE — Assessment & Plan Note (Signed)
Wants to try Chantix again

## 2015-09-01 LAB — COMPREHENSIVE METABOLIC PANEL
A/G RATIO: 1.8 (ref 1.2–2.2)
ALT: 22 IU/L (ref 0–32)
AST: 26 IU/L (ref 0–40)
Albumin: 4.6 g/dL (ref 3.6–4.8)
Alkaline Phosphatase: 104 IU/L (ref 39–117)
BUN/Creatinine Ratio: 12 (ref 12–28)
BUN: 9 mg/dL (ref 8–27)
Bilirubin Total: 0.8 mg/dL (ref 0.0–1.2)
CALCIUM: 9.8 mg/dL (ref 8.7–10.3)
CHLORIDE: 98 mmol/L (ref 96–106)
CO2: 26 mmol/L (ref 18–29)
Creatinine, Ser: 0.73 mg/dL (ref 0.57–1.00)
GFR calc Af Amer: 100 mL/min/{1.73_m2} (ref >59)
GFR, EST NON AFRICAN AMERICAN: 87 mL/min/{1.73_m2} (ref >59)
Globulin, Total: 2.6 g/dL (ref 1.5–4.5)
Glucose: 98 mg/dL (ref 65–99)
POTASSIUM: 4.7 mmol/L (ref 3.5–5.2)
Sodium: 140 mmol/L (ref 134–144)
Total Protein: 7.2 g/dL (ref 6.0–8.5)

## 2015-09-01 LAB — TSH: TSH: 1.07 u[IU]/mL (ref 0.450–4.500)

## 2015-09-03 ENCOUNTER — Encounter: Payer: Self-pay | Admitting: Unknown Physician Specialty

## 2015-09-03 NOTE — Progress Notes (Signed)
Normal labs.  Patient notified by letter.

## 2015-09-25 ENCOUNTER — Telehealth: Payer: Self-pay | Admitting: Unknown Physician Specialty

## 2015-09-25 MED ORDER — CLONAZEPAM 0.5 MG PO TABS: 0.5000 mg | ORAL_TABLET | Freq: Every day | ORAL | 0 refills | Status: AC | PRN

## 2015-09-25 NOTE — Telephone Encounter (Signed)
Routing to provider. Elnita MaxwellCheryl, how many should the patient take per day? Directions on rx are not clear.

## 2015-09-25 NOTE — Telephone Encounter (Signed)
Pharmacy would like clarification on TakingNot TakingUnknown  clonazePAM (KLONOPIN) 0.5 MG tablet.

## 2015-09-26 NOTE — Telephone Encounter (Signed)
Called pharmacy. They wanted clarification on directions, new rx was written.

## 2015-10-03 ENCOUNTER — Telehealth: Payer: Self-pay | Admitting: Unknown Physician Specialty

## 2015-10-03 NOTE — Telephone Encounter (Signed)
Routing to provider. Is there another dose of chantix?

## 2015-10-03 NOTE — Telephone Encounter (Signed)
Pt called and stated she feels like  her varenicline (CHANTIX CONTINUING MONTH PAK) 1 MG tablet  Dosage needs to be changed.

## 2015-10-03 NOTE — Telephone Encounter (Signed)
Have her take 1/2 pill of what she has for 2 weeks

## 2015-10-03 NOTE — Telephone Encounter (Signed)
I sent her 2.  One is the beginning pack you take for a month and the following packs are a higher dose.  Start with the lower dose

## 2015-10-03 NOTE — Telephone Encounter (Signed)
Called and left patient a VM asking for her to please return my call.  

## 2015-10-03 NOTE — Telephone Encounter (Signed)
Patient returned call. She stated that she didn't get the first part of the medication until after she had already started taking the strong dosage. Patient wants to know if she can have a week supply sent in of the 0.5 mg tablets so she can start back on track.

## 2015-10-03 NOTE — Telephone Encounter (Signed)
Called and let patient know Cheryl's directions.

## 2015-10-19 ENCOUNTER — Telehealth: Payer: Self-pay

## 2015-10-19 NOTE — Telephone Encounter (Signed)
Opened in error

## 2015-10-22 ENCOUNTER — Telehealth: Payer: Self-pay

## 2015-10-22 NOTE — Telephone Encounter (Signed)
-----   Message from Gabriel Cirriheryl Wicker, NP sent at 10/19/2015 10:08 AM EDT ----- I have down the pt is overdue for bone density and mammogram ----- Message ----- From: SYSTEM Sent: 10/14/2015  12:05 AM To: Gabriel Cirriheryl Wicker, NP

## 2015-10-22 NOTE — Telephone Encounter (Signed)
Called and left patient a VM asking for her to please return my call.  

## 2015-10-25 NOTE — Telephone Encounter (Signed)
Patient is no longer a patient here in our office. She has moved to a physician in [place], and has requested her records from us.

## 2015-10-25 NOTE — Telephone Encounter (Signed)
Thanks

## 2016-05-31 IMAGING — MR MRI HEAD WITHOUT AND WITH CONTRAST
12 of 13 series · 39 of 48 positions shown · IV contrast (10 ML MULTIHANCE)
Comparison: None.

CLINICAL DATA: Numbness in both hands and fingers over the last 4-6
months.

EXAM:
MRI HEAD WITHOUT AND WITH CONTRAST
TECHNIQUE: Multiplanar, multiecho pulse sequences of the brain and surrounding
structures were obtained without and with intravenous contrast.
CONTRAST:  10 mL MultiHance

[Series 2: T1 · sagittal · 5.0mm · 0.45mm/px · 1 of 28 slices shown]
[im 1/28]
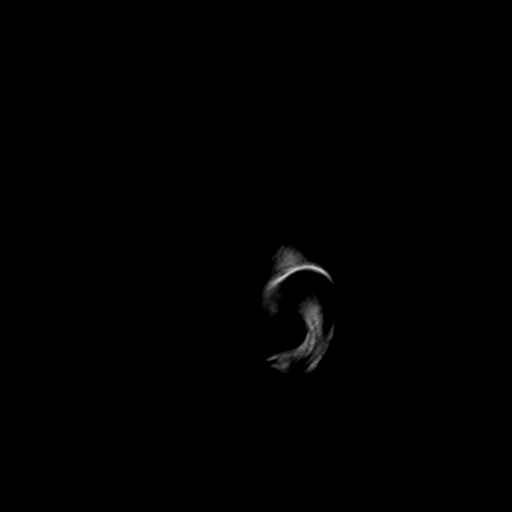

[Series 4: DWI · axial · 5.0mm · 1.80mm/px · z∈[-27,+133]mm · 3 of 26 slices shown (1 of 4)]
[im 1/26]
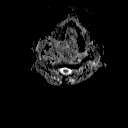
[im 13/26]
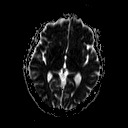
[im 26/26]
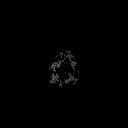

[Series 5: DWI · axial · 5.0mm · 1.80mm/px · z∈[-20,+126]mm · 3 of 24 slices shown (2 of 4)]
[im 1/24]
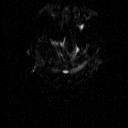
[im 12/24]
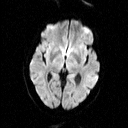
[im 24/24]
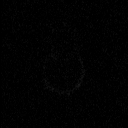

[Series 7: DWI · coronal · 5.0mm · 1.80mm/px · 4 of 38 slices shown (3 of 4)]
[im 1/38]
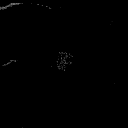
[im 13/38]
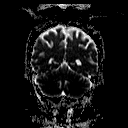
[im 25/38]
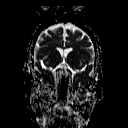
[im 38/38]
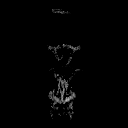

[Series 8: DWI · coronal · 5.0mm · 1.80mm/px · 4 of 37 slices shown (4 of 4)]
[im 1/37]
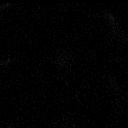
[im 13/37]
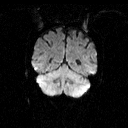
[im 25/37]
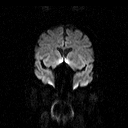
[im 37/37]
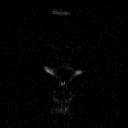

[Series 9: FLAIR · sagittal · 5.0mm · 0.90mm/px · 3 of 28 slices shown (1 of 2)]
[im 1/28]
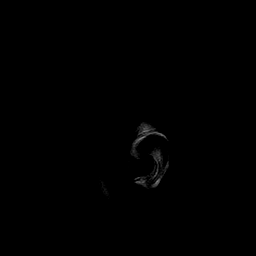
[im 14/28]
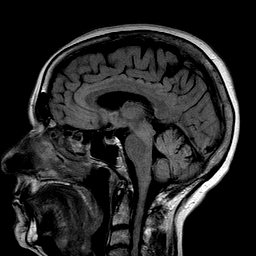
[im 28/28]
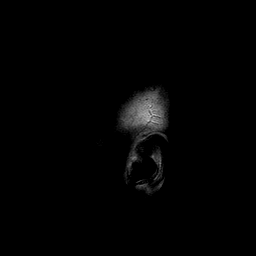

[Series 10: T2 · axial · 5.0mm · 0.45mm/px · z∈[-28,+132]mm · 3 of 26 slices shown (1 of 3)]
[im 1/26]
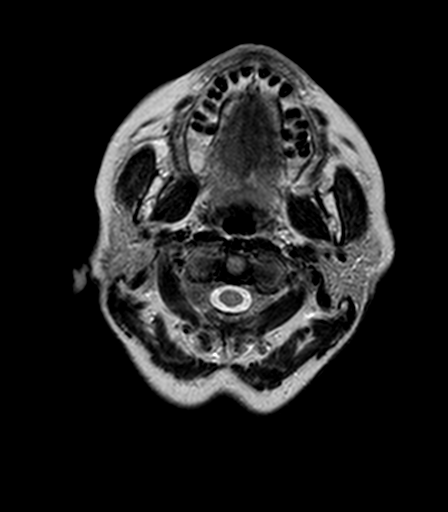
[im 13/26]
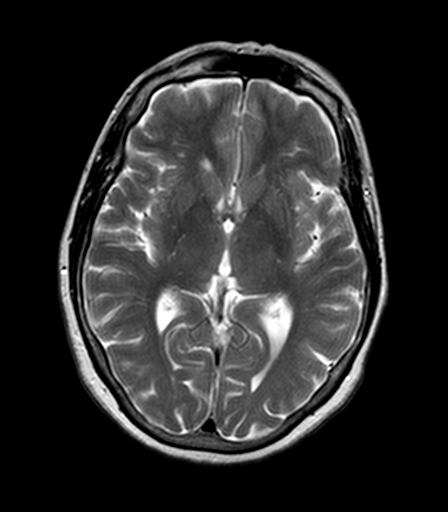
[im 26/26]
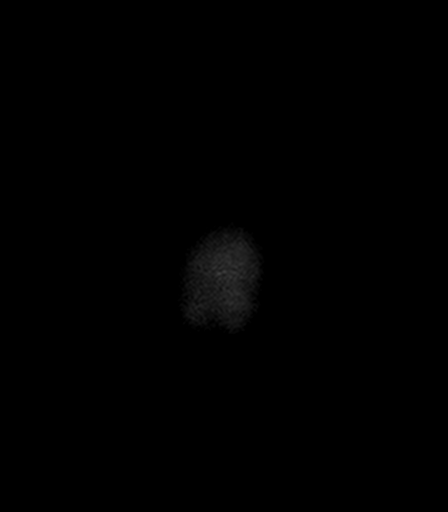

[Series 11: FLAIR · axial · 5.0mm · 0.90mm/px · z∈[-28,+131]mm · 3 of 26 slices shown (2 of 2)]
[im 1/26]
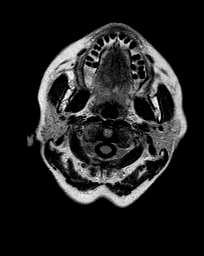
[im 13/26]
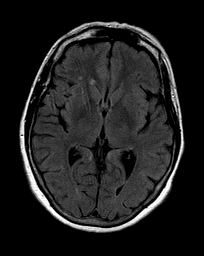
[im 26/26]
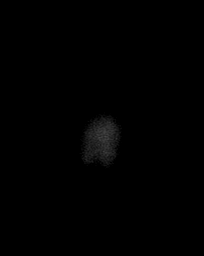

[Series 12: T2 · axial · 5.0mm · 0.45mm/px · z∈[-28,+132]mm · 3 of 26 slices shown (2 of 3)]
[im 1/26]
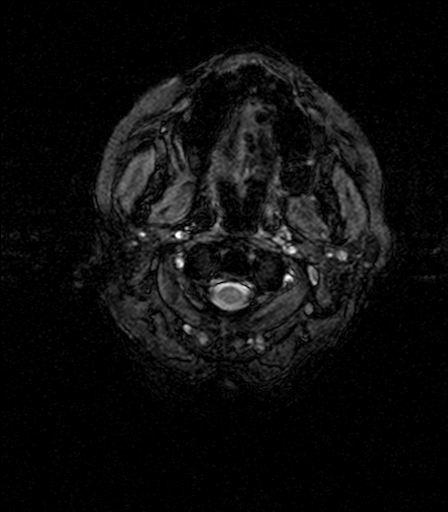
[im 13/26]
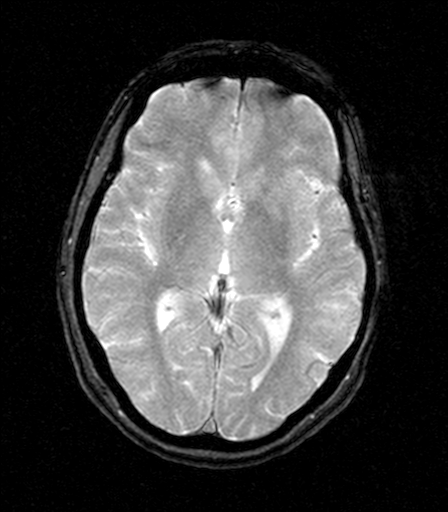
[im 26/26]
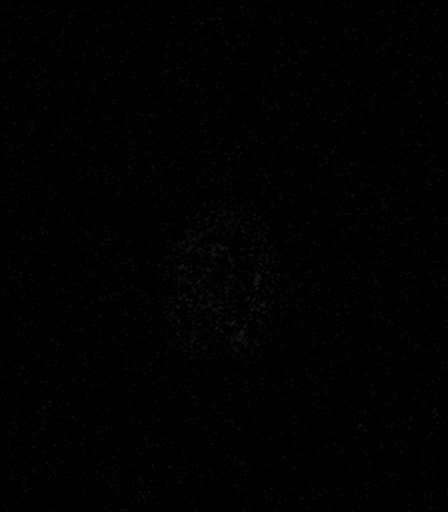

[Series 14: T2 · coronal · 5.0mm · 0.45mm/px · 3 of 30 slices shown (3 of 3)]
[im 1/30]
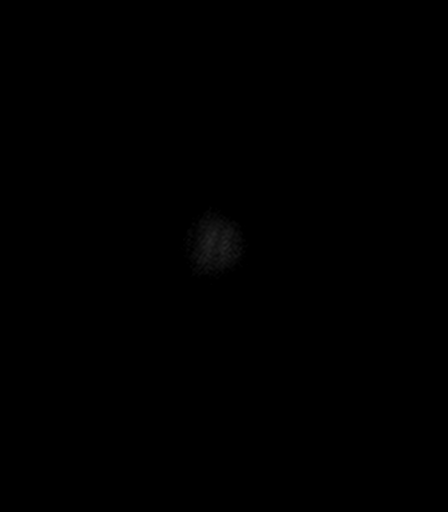
[im 15/30]
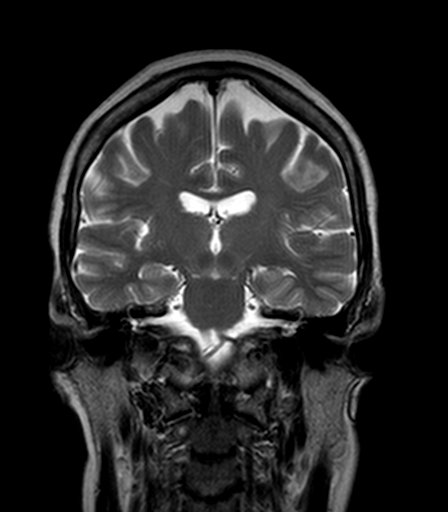
[im 30/30]
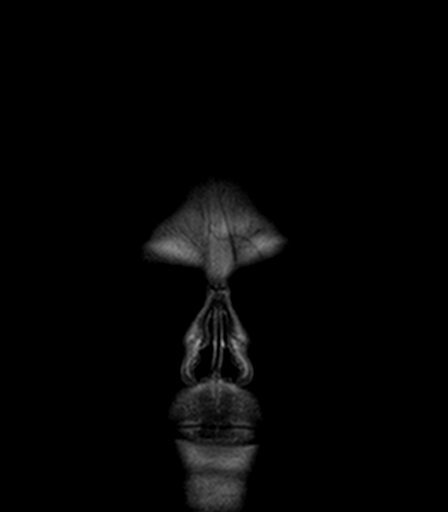

[Series 15: T1 post-contrast · axial · 3.0mm · 0.45mm/px · z∈[-30,+132]mm · 6 of 56 slices shown (1 of 2)]
[im 1/56]
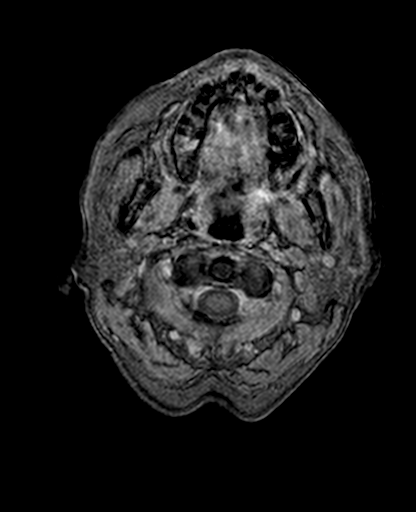
[im 12/56]
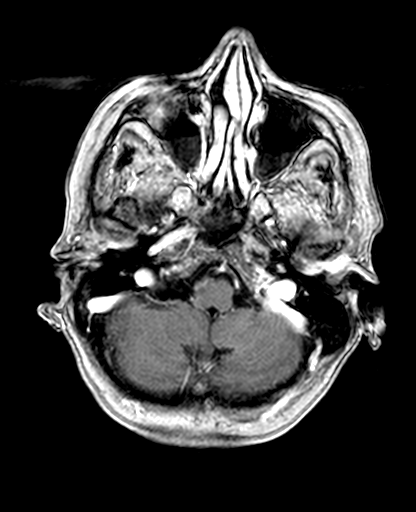
[im 23/56]
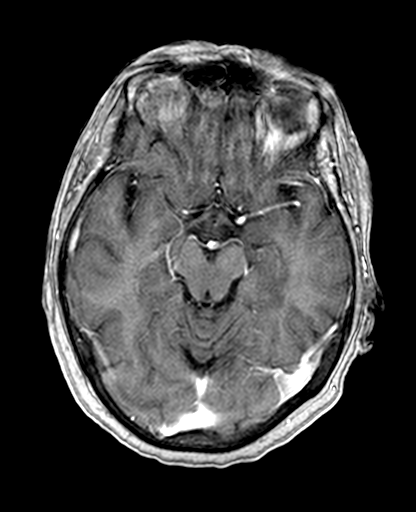
[im 34/56]
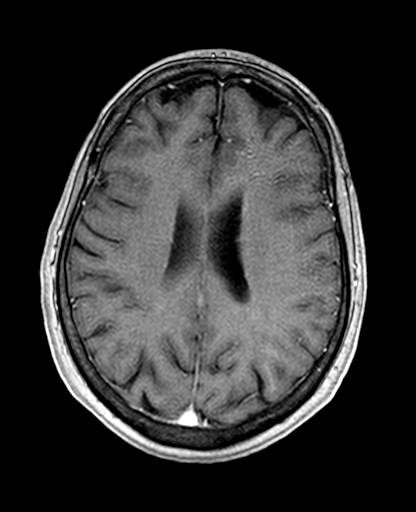
[im 45/56]
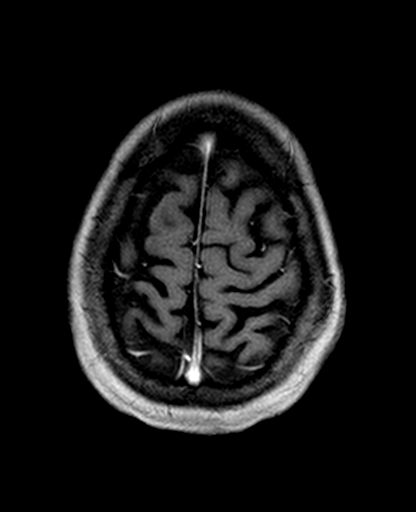
[im 56/56]
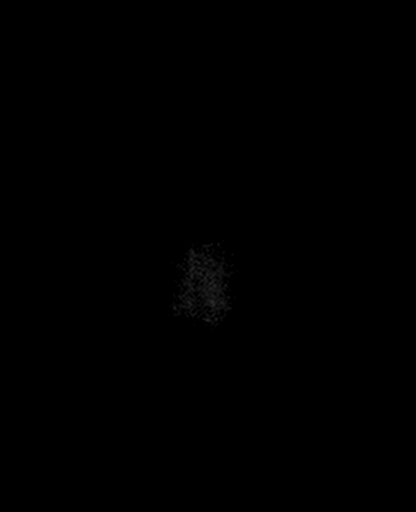

[Series 16: T1 post-contrast · coronal · 5.0mm · 0.45mm/px · 3 of 30 slices shown (2 of 2)]
[im 1/30]
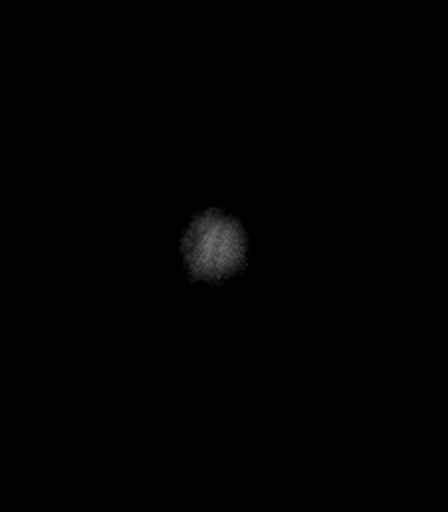
[im 15/30]
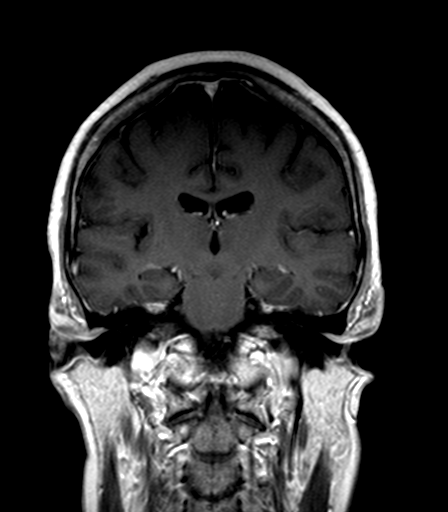
[im 30/30]
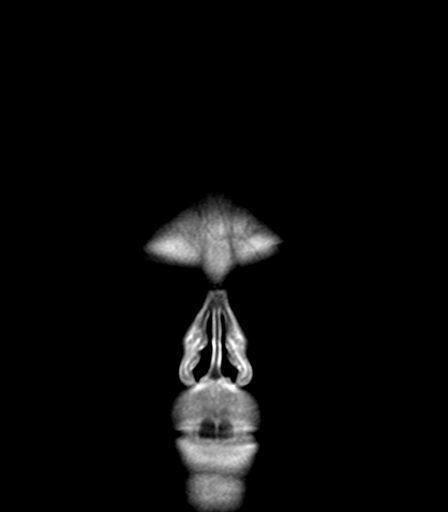

[39 of 48 positions shown; findings below may reference images not displayed]

FINDINGS: Minimal periventricular white matter changes bilaterally are likely
within normal limits for age. Two or 3 scattered subcortical T2
hyperintensities are likely normal as well. No acute infarct,
hemorrhage, or mass lesion is present. The ventricles are of normal
size. No significant extraaxial fluid collection is present.

Flow is present in the major intracranial arteries. The globes and
orbits are intact. Fluid levels are present within the maxillary
sinuses bilaterally. There is scattered mucosal thickening
throughout the ethmoid air cells and minimal mucosal thickening in
the right frontal sinus. Mild mucosal thickening is noted within the
sphenoid sinuses bilaterally. There is some fluid in the left
mastoid air cells. No obstructing nasopharyngeal lesion is evident.

The postcontrast images demonstrate no pathologic enhancement.
IMPRESSION: 1. Minimal periventricular and subcortical white matter changes are
likely within normal limits for age. These could be related to a
demyelinating process, although very mild.
2. No acute intracranial abnormality.
3. Fluid levels in the maxillary sinuses bilaterally compatible with
acute sinusitis.
4. Mild diffuse mucosal thickening throughout the remainder the
paranasal sinuses.
5. Small left mastoid effusion. No obstructing nasopharyngeal lesion
is evident.

## 2016-08-14 ENCOUNTER — Telehealth: Payer: Self-pay | Admitting: Unknown Physician Specialty

## 2016-08-14 NOTE — Telephone Encounter (Signed)
Called pt to schedule for Annual Wellness Visit with Nurse Health Advisor, Tiffany Hill, my c/b # is [phone]  Kathryn Brown ° °

## 2016-09-10 ENCOUNTER — Telehealth: Payer: Self-pay | Admitting: Unknown Physician Specialty

## 2016-09-10 NOTE — Telephone Encounter (Signed)
Called pt to schedule for Annual Wellness Visit with Nurse Health Advisor, Tiffany Hill, my c/b # is [phone]  Jade Green ° °

## 2016-10-10 ENCOUNTER — Telehealth: Payer: Self-pay | Admitting: Unknown Physician Specialty

## 2016-10-10 NOTE — Telephone Encounter (Signed)
Spoke to pt she has transferred her care to her new physician in [place], she mentioned she had requested to have her medical records transferred up to her new provider and did not hear back. Could you please call her at her home phone # to f/up. Thank you, KB

## 2016-10-10 NOTE — Telephone Encounter (Signed)
Called and left patient a VM asking for her to please return my call. Patient would need to sign a records release form for [place] to be able to send her records to her new provider. Not sure if patient has done this yet or not.

## 2016-10-14 NOTE — Telephone Encounter (Signed)
Called and left patient a VM asking for her to please return my call.  

## 2016-10-21 NOTE — Telephone Encounter (Signed)
Called and spoke with patient. I asked if she has signed a release form for her records and she states that she has and that her new office has faxed the request twice. I apologized and explained that it does not look like we have gotten anything. I provided our fax number to the patient and she states that she may stop by her new office to have them send the request again. Patient also states that they come to [facility] sometimes so she may just stop by here and sign one here with [place]. Patient states she will do one or the other.

## 2021-03-04 NOTE — Progress Notes (Signed)
[facility] - Clinic Note  03/05/2021     CHIEF COMPLAINT Patient presents for Retina Evaluation   HISTORY OF PRESENT ILLNESS: Jade Green is a 71 y.o. female who presents to the clinic today for:   HPI     Retina Evaluation   In both eyes.  Associated Symptoms Floaters.  I, the attending physician,  performed the HPI with the patient and updated documentation appropriately.        Comments   Retina eval per Dr. Clydene Pugh for PVD and Retinal hole OS- Around the first of Feb. she noticed floaters OD.  She had a f/u appt 02/28/21, Dx PVD OD and an area OS he wanted her to have evaluated. Denies FOL's.  She has a large floater OD that is constant.  Once it covers her vision, her vision is blurred.   Pseudophakia OU, Dr. Elmer Picker      Last edited by Rennis Chris, MD on 03/06/2021  2:30 AM.    Pt is here on the referral of Dr. Clydene Pugh for concern of floaters OD, she states she saw him bc the floaters are making her vision blurry, Dr. Clydene Pugh also saw something in her left eye that he wanted to have evaluated, pt denies fol or floaters in the left eye, pt has had cataract sx with Dr. Elmer Picker, OS is near vision, OS is distance vision  Referring physician: Isla Pence, OD [address] [place],  [place] [address]  HISTORICAL INFORMATION:   Selected notes from the MEDICAL RECORD NUMBER Referred by Dr. Clydene Pugh for concern of PVD / retinal hole OS LEE:  Ocular Hx- PMH-    CURRENT MEDICATIONS: No current outpatient medications on file. (Ophthalmic Drugs)   No current facility-administered medications for this visit. (Ophthalmic Drugs)   Current Outpatient Medications (Other)  Medication Sig   clonazePAM (KLONOPIN) 0.5 MG tablet Take 1 tablet (0.5 mg total) by mouth daily as needed for anxiety.   Cyanocobalamin 1000 MCG TBCR Take 1 tablet by mouth daily.    levothyroxine (SYNTHROID) 112 MCG tablet Take 1 tablet (112 mcg total) by mouth daily.   Multiple  Vitamin (MULTIVITAMIN) capsule Take 1 capsule by mouth daily.    buPROPion (WELLBUTRIN XL) 300 MG 24 hr tablet Take 1 tablet (300 mg total) by mouth daily.   cetirizine (ZYRTEC) 10 MG tablet Take 10 mg by mouth daily.    varenicline (CHANTIX CONTINUING MONTH PAK) 1 MG tablet Take 1 tablet (1 mg total) by mouth 2 (two) times daily.   varenicline (CHANTIX) 0.5 MG tablet Take 1 tablet (0.5 mg total) by mouth 2 (two) times daily.   No current facility-administered medications for this visit. (Other)   REVIEW OF SYSTEMS: ROS   Positive for: Gastrointestinal, Endocrine, Eyes, Psychiatric Negative for: Constitutional, Neurological, Skin, Genitourinary, Musculoskeletal, HENT, Cardiovascular, Respiratory, Allergic/Imm, Heme/Lymph Last edited by Joni Reining, COA on 03/05/2021  1:10 PM.     ALLERGIES Allergies  Allergen Reactions   Amoxicillin-Pot Clavulanate Nausea And Vomiting   Codeine     REACTION: Nausea   PAST MEDICAL HISTORY Past Medical History:  Diagnosis Date   Allergy    Anxiety    Depression    GERD (gastroesophageal reflux disease)    Hypertension    Panic attack    Panic disorder    Thyroid disease    Past Surgical History:  Procedure Laterality Date   APPENDECTOMY  04/20/2012   acute suppurative appendicitis. And a   CATARACT  EXTRACTION, BILATERAL Bilateral    x2, May 2017 and July 2017   COLONOSCOPY  [id number]   TUBAL LIGATION  1983   FAMILY HISTORY Family History  Problem Relation Age of Onset   COPD Mother    Heart disease Maternal Grandmother    Cancer Paternal Grandfather    SOCIAL HISTORY Social History   Tobacco Use   Smoking status: Every Day    Packs/day: 0.50    Years: 20.00    Pack years: 10.00    Types: Cigarettes   Smokeless tobacco: Never  Substance Use Topics   Alcohol use: Yes    Comment: on occasion   Drug use: No       OPHTHALMIC EXAM:  Base Eye Exam     Visual Acuity (Snellen - Linear)       Right Left   Dist [place] 20/40  20/20 -2   Near [place] 20/30   Phaco monovision OD near, OS dist        Tonometry (Tonopen, 1:21 PM)       Right Left   Pressure 15 13         Pupils       Dark Light Shape React APD   Right 2 1 Round Brisk None   Left 2 1 Round Brisk None         Visual Fields (Counting fingers)       Left Right    Full Full         Extraocular Movement       Right Left    Full Full         Neuro/Psych     Oriented x3: Yes   Mood/Affect: Normal         Dilation     Both eyes: 1.0% Mydriacyl, 2.5% Phenylephrine @ 1:21 PM           Slit Lamp and Fundus Exam     Slit Lamp Exam       Right Left   Lids/Lashes Dermatochalasis - upper lid Dermatochalasis - upper lid   Conjunctiva/Sclera White and quiet White and quiet   Cornea mild arcus, well healed cataract wound mild arcus, well healed cataract wound   Anterior Chamber Deep and quiet Deep and quiet   Iris Round and dilated Round and dilated   Lens PC IOL in good position PC IOL in good position   Anterior Vitreous Vitreous syneresis, Posterior vitreous detachment, vitreous condensations Vitreous syneresis         Fundus Exam       Right Left   Disc Pink and Sharp, Tilted, temporal PPA Pink and Sharp, Tilted, temporal PPA   C/D Ratio 0.5 0.5   Macula Flat, Blunted foveal reflex, mild RPE mottling, No heme or edema Flat, Blunted foveal reflex, mild RPE mottling, No heme or edema   Vessels mild attenuation, mild tortuosity mild attenuation, mild tortuosity, AV crossing changes   Periphery Attached, No heme Attached, tiny operculated hole at 0130, no SRF           Refraction     Manifest Refraction       Sphere Cylinder Dist [place]   Right -0.75 Sphere 20/25+2   Left               IMAGING AND PROCEDURES  Imaging and Procedures for 03/05/2021  OCT, Retina - OU - Both Eyes       Right Eye Quality was good. Central Foveal Thickness: 283. Progression  has no prior data. Findings include normal  foveal contour, no IRF, no SRF, myopic contour.   Left Eye Quality was good. Central Foveal Thickness: 299. Progression has no prior data. Findings include normal foveal contour, no IRF, no SRF, vitreomacular adhesion , myopic contour.   Notes *Images captured and stored on drive  Diagnosis / Impression:  NFP; no IRF/SRF OU OD: PVD OS: +VMA   Clinical management:  See below  Abbreviations: NFP - Normal foveal profile. CME - cystoid macular edema. PED - pigment epithelial detachment. IRF - intraretinal fluid. SRF - subretinal fluid. EZ - ellipsoid zone. ERM - epiretinal membrane. ORA - outer retinal atrophy. ORT - outer retinal tubulation. SRHM - subretinal hyper-reflective material. IRHM - intraretinal hyper-reflective material      Repair Retinal Breaks, Laser - OS - Left Eye       LASER PROCEDURE NOTE  Procedure:  Barrier laser retinopexy using slit lamp laser, LEFT eye   Diagnosis:   Operculated retinal hole, LEFT eye                     0130 o'clock anterior to equator   Surgeon: Rennis ChrisBrian Hanna Ra, MD, PhD  Anesthesia: Topical  Informed consent obtained, operative eye marked, and time out performed prior to initiation of laser.   Laser settings:  Lumenis Smart532 laser, slit lamp Lens: Mainster PRP 165 Power: 270 mW Spot size: 200 microns Duration: 30 msec  # spots: 184  Placement of laser: Using a [place]ster PRP 165 contact lens at the slit lamp, laser was placed in three confluent rows around flap tear at 1 oclock anterior to equator with additional rows anteriorly.  Complications: None.  Patient tolerated the procedure well and received written and verbal post-procedure care information/education.            ASSESSMENT/PLAN:    ICD-10-CM   1. Retinal hole of left eye  H33.322 Repair Retinal Breaks, Laser - OS - Left Eye    2. Posterior vitreous detachment of right eye  H43.811 OCT, Retina - OU - Both Eyes    3. Essential hypertension  I10     4.  Hypertensive retinopathy of both eyes  H35.033     5. Pseudophakia, both eyes  Z96.1      Retinal Hole OS  - The incidence, risk factors, and natural history of retinal tear was discussed with patient.   - Potential treatment options including laser retinopexy and cryotherapy discussed with patient.  - small operculated hole at 0130 -- no SRF - recommend laser retinopexy OS today, 02.28.23 - pt wishes to proceed with laser - RBA of procedure discussed, questions answered - informed consent obtained and signed - see procedure note - start Lotemax QID OS x7 days - f/u in 3-4 wks, DFE, OCT  2. PVD / vitreous syneresis OD  - symptomatic floaters since the beginning of February -- improving  - Discussed findings and prognosis  - No RT or RD on 360 scleral depressed exam  - Reviewed s/s of RT/RD  - Strict return precautions for any such RT/RD signs/symptoms  - f/u in 3-4 wks -- DFE/OCT  3,4.Hypertensive retinopathy OU - discussed importance of tight BP control - monitor  5. Pseudophakia OU  - s/p CE/IOL (Dr. Elmer PickerHecker)  - IOL in good position, doing well  - monitor  Ophthalmic Meds Ordered this visit:  No orders of the defined types were placed in this encounter.    Return for f/u 3-4  weeks, retinal hole OS.  There are no Patient Instructions on file for this visit.   Explained the diagnoses, plan, and follow up with the patient and they expressed understanding.  Patient expressed understanding of the importance of proper follow up care.   This document serves as a record of services personally performed by Karie Chimera, MD, PhD. It was created on their behalf by Glee Arvin. Manson Passey, OA an ophthalmic technician. The creation of this record is the provider's dictation and/or activities during the visit.    Electronically signed by: Glee Arvin. Manson Passey, [place] 02.27.2023 2:30 AM   Karie Chimera, M.D., Ph.D. Diseases & Surgery of the Retina and Vitreous [facility]  I have reviewed the above documentation for accuracy and completeness, and I agree with the above. Karie Chimera, M.D., Ph.D. 03/06/21 2:32 AM   Abbreviations: M myopia (nearsighted); A astigmatism; H hyperopia (farsighted); P presbyopia; Mrx spectacle prescription;  CTL contact lenses; OD right eye; OS left eye; OU both eyes  XT exotropia; ET esotropia; PEK punctate epithelial keratitis; PEE punctate epithelial erosions; DES dry eye syndrome; MGD meibomian gland dysfunction; ATs artificial tears; PFAT's preservative free artificial tears; NSC nuclear sclerotic cataract; PSC posterior subcapsular cataract; ERM epi-retinal membrane; PVD posterior vitreous detachment; RD retinal detachment; DM diabetes mellitus; DR diabetic retinopathy; NPDR non-proliferative diabetic retinopathy; PDR proliferative diabetic retinopathy; CSME clinically significant macular edema; DME diabetic macular edema; dbh dot blot hemorrhages; CWS cotton wool spot; POAG primary open angle glaucoma; C/D cup-to-disc ratio; HVF humphrey visual field; GVF goldmann visual field; OCT optical coherence tomography; IOP intraocular pressure; BRVO Branch retinal vein occlusion; CRVO central retinal vein occlusion; CRAO central retinal artery occlusion; BRAO branch retinal artery occlusion; RT retinal tear; SB scleral buckle; PPV pars plana vitrectomy; VH Vitreous hemorrhage; PRP panretinal laser photocoagulation; IVK intravitreal kenalog; VMT vitreomacular traction; MH Macular hole;  NVD neovascularization of the disc; NVE neovascularization elsewhere; AREDS age related eye disease study; ARMD age related macular degeneration; POAG primary open angle glaucoma; EBMD epithelial/anterior basement membrane dystrophy; ACIOL anterior chamber intraocular lens; IOL intraocular lens; PCIOL posterior chamber intraocular lens; Phaco/IOL phacoemulsification with intraocular lens placement; PRK photorefractive keratectomy; LASIK laser assisted in situ  keratomileusis; HTN hypertension; DM diabetes mellitus; COPD chronic obstructive pulmonary disease

## 2021-03-05 ENCOUNTER — Encounter (INDEPENDENT_AMBULATORY_CARE_PROVIDER_SITE_OTHER): Payer: Self-pay | Admitting: Ophthalmology

## 2021-03-05 ENCOUNTER — Other Ambulatory Visit: Payer: Self-pay

## 2021-03-05 ENCOUNTER — Ambulatory Visit (INDEPENDENT_AMBULATORY_CARE_PROVIDER_SITE_OTHER): Payer: Medicare Other | Admitting: Ophthalmology

## 2021-03-05 DIAGNOSIS — Z961 Presence of intraocular lens: Secondary | ICD-10-CM | POA: Diagnosis not present

## 2021-03-05 DIAGNOSIS — H43811 Vitreous degeneration, right eye: Secondary | ICD-10-CM | POA: Diagnosis not present

## 2021-03-05 DIAGNOSIS — H3581 Retinal edema: Secondary | ICD-10-CM

## 2021-03-05 DIAGNOSIS — H35033 Hypertensive retinopathy, bilateral: Secondary | ICD-10-CM | POA: Diagnosis not present

## 2021-03-05 DIAGNOSIS — H33322 Round hole, left eye: Secondary | ICD-10-CM | POA: Diagnosis not present

## 2021-03-05 DIAGNOSIS — I10 Essential (primary) hypertension: Secondary | ICD-10-CM

## 2021-03-22 NOTE — Progress Notes (Addendum)
?[facility] Note ? ?03/27/2021 ? ?  ? ?CHIEF COMPLAINT ?Patient presents for Retina Follow Up ? ? ?HISTORY OF PRESENT ILLNESS: ?Jade Green is a 71 y.o. female who presents to the clinic today for:  ? ?HPI   ? ? Retina Follow Up   ?Patient presents with  Other.  In left eye.  Severity is moderate.  Duration of 3 weeks.  Since onset it is stable.  I, the attending physician,  performed the HPI with the patient and updated documentation appropriately. ? ?  ?  ? ? Comments   ?Pt here for 3 wk ret f/u for ret hole OS. Pt states VA is the same, still seeing floater in OD.  ? ?  ?  ?Last edited by Bernarda Caffey, MD on 03/27/2021  1:45 PM.  ?  ?Pt states floaters OD have not gotten any better ? ?Referring physician: ?No referring provider defined for this encounter. ? ?HISTORICAL INFORMATION:  ? ?Selected notes from the [facility] ?Referred by Dr. Ellin Mayhew for concern of PVD / retinal hole OS ?LEE:  ?Ocular Hx- ?PMH- ?  ? ?CURRENT MEDICATIONS: ?No current outpatient medications on file. (Ophthalmic Drugs)  ? ?No current facility-administered medications for this visit. (Ophthalmic Drugs)  ? ?Current Outpatient Medications (Other)  ?Medication Sig  ? clonazePAM (KLONOPIN) 0.5 MG tablet Take 1 tablet (0.5 mg total) by mouth daily as needed for anxiety.  ? Cyanocobalamin 1000 MCG TBCR Take 1 tablet by mouth daily.   ? levothyroxine (SYNTHROID) 112 MCG tablet Take 1 tablet (112 mcg total) by mouth daily.  ? Multiple Vitamin (MULTIVITAMIN) capsule Take 1 capsule by mouth daily.   ? buPROPion (WELLBUTRIN XL) 300 MG 24 hr tablet Take 1 tablet (300 mg total) by mouth daily.  ? cetirizine (ZYRTEC) 10 MG tablet Take 10 mg by mouth daily.   ? varenicline (CHANTIX CONTINUING MONTH PAK) 1 MG tablet Take 1 tablet (1 mg total) by mouth 2 (two) times daily.  ? varenicline (CHANTIX) 0.5 MG tablet Take 1 tablet (0.5 mg total) by mouth 2 (two) times daily.  ? ?No current facility-administered  medications for this visit. (Other)  ? ?REVIEW OF SYSTEMS: ?ROS   ?Positive for: Gastrointestinal, Endocrine, Eyes, Psychiatric ?Negative for: Constitutional, Neurological, Skin, Genitourinary, Musculoskeletal, HENT, Cardiovascular, Respiratory, Allergic/Imm, Heme/Lymph ?Last edited by Kingsley Spittle, COT on 03/27/2021  1:16 PM.  ?  ? ?ALLERGIES ?Allergies  ?Allergen Reactions  ? Amoxicillin-Pot Clavulanate Nausea And Vomiting  ? Codeine   ?  REACTION: Nausea  ? ?PAST MEDICAL HISTORY ?Past Medical History:  ?Diagnosis Date  ? Allergy   ? Anxiety   ? Depression   ? GERD (gastroesophageal reflux disease)   ? Hypertension   ? Panic attack   ? Panic disorder   ? Thyroid disease   ? ?Past Surgical History:  ?Procedure Laterality Date  ? APPENDECTOMY  04/20/2012  ? acute suppurative appendicitis. And a  ? CATARACT EXTRACTION, BILATERAL Bilateral   ? x2, May 2017 and July 2017  ? COLONOSCOPY  [id number]  ? TUBAL LIGATION  1983  ? ?FAMILY HISTORY ?Family History  ?Problem Relation Age of Onset  ? COPD Mother   ? Heart disease Maternal Grandmother   ? Cancer Paternal Grandfather   ? ?SOCIAL HISTORY ?Social History  ? ?Tobacco Use  ? Smoking status: Every Day  ?  Packs/day: 0.50  ?  Years: 20.00  ?  Pack years: 10.00  ?  Types: Cigarettes  ? Smokeless tobacco: Never  ?Substance Use Topics  ? Alcohol use: Yes  ?  Comment: on occasion  ? Drug use: No  ?  ? ?  ?OPHTHALMIC EXAM: ? ?Base Eye Exam   ? ? Visual Acuity (Snellen - Linear)   ? ?   Right Left  ? Dist [place] 20/25 -2 20/20 -1  ? Dist ph [place] 20/20 -2   ? ?  ?  ? ? Tonometry (Tonopen, 1:23 PM)   ? ?   Right Left  ? Pressure 16 13  ? ?  ?  ? ? Pupils   ? ?   Dark Light Shape React APD  ? Right 2 1 Round Brisk None  ? Left 2 1 Round Brisk None  ? ?  ?  ? ? Visual Fields (Counting fingers)   ? ?   Left Right  ?  Full Full  ? ?  ?  ? ? Extraocular Movement   ? ?   Right Left  ?  Full, Ortho Full, Ortho  ? ?  ?  ? ? Neuro/Psych   ? ? Oriented x3: Yes  ? Mood/Affect: Normal  ? ?   ?  ? ? Dilation   ? ? Both eyes: 1.0% Mydriacyl, 2.5% Phenylephrine @ 1:24 PM  ? ?  ?  ? ?  ? ?Slit Lamp and Fundus Exam   ? ? Slit Lamp Exam   ? ?   Right Left  ? Lids/Lashes Dermatochalasis - upper lid Dermatochalasis - upper lid  ? Conjunctiva/Sclera White and quiet White and quiet  ? Cornea mild arcus, well healed cataract wound mild arcus, well healed cataract wound  ? Anterior Chamber Deep and quiet Deep and quiet  ? Iris Round and dilated Round and dilated  ? Lens PC IOL in good position PC IOL in good position  ? Anterior Vitreous Vitreous syneresis, Posterior vitreous detachment, vitreous condensations Vitreous syneresis, vitreous condensations  ? ?  ?  ? ? Fundus Exam   ? ?   Right Left  ? Disc Pink and Sharp, Tilted, temporal PPA Pink and Sharp, Tilted, temporal PPA  ? C/D Ratio 0.5 0.5  ? Macula Flat, Blunted foveal reflex, mild RPE mottling, No heme or edema Flat, good foveal reflex, mild RPE mottling, No heme or edema  ? Vessels mild attenuation, mild tortuosity mild tortuosity, attenuated  ? Periphery Attached, No heme, No RT/RD Attached, tiny operculated hole at 0130--good laser changes surrounding, no SRF  ? ?  ?  ? ?  ? ? ?IMAGING AND PROCEDURES  ?Imaging and Procedures for 03/27/2021 ? ?OCT, Retina - OU - Both Eyes   ? ?   ?Right Eye ?Quality was good. Central Foveal Thickness: 280. Progression has been stable. Findings include normal foveal contour, no IRF, no SRF, myopic contour.  ? ?Left Eye ?Quality was good. Central Foveal Thickness: 300. Progression has been stable. Findings include normal foveal contour, no IRF, no SRF, myopic contour (Partial PVD).  ? ?Notes ?*Images captured and stored on drive ? ?Diagnosis / Impression:  ?NFP; no IRF/SRF OU ?OD: PVD ?OS: partial PVD ? ? ?Clinical management:  ?See below ? ?Abbreviations: NFP - Normal foveal profile. CME - cystoid macular edema. PED - pigment epithelial detachment. IRF - intraretinal fluid. SRF - subretinal fluid. EZ - ellipsoid zone. ERM  - epiretinal membrane. ORA - outer retinal atrophy. ORT - outer retinal tubulation. SRHM - subretinal hyper-reflective material. IRHM - intraretinal hyper-reflective  material ? ? ?  ?  ?  ? ?  ?ASSESSMENT/PLAN: ? ?  ICD-10-CM   ?1. Retinal hole of left eye  H33.322 OCT, Retina - OU - Both Eyes  ?  ?2. Posterior vitreous detachment of right eye  H43.811 OCT, Retina - OU - Both Eyes  ?  ?3. Essential hypertension  I10   ?  ?4. Hypertensive retinopathy of both eyes  H35.033   ?  ?5. Pseudophakia, both eyes  Z96.1   ?  ? ?Retinal Hole OS  ? - small operculated hole at 0130 -- no SRF ?- s/p laser retinopexy OS (02.28.23) -- good laser changes surrounding ?- completed Lotemax QID OS x7 days ?- no new RT/RD ?- f/u in 3 months, DFE, OCT ? ?2. PVD / vitreous syneresis OD ? - symptomatic floaters since the beginning of February -- improving ? - Discussed findings and prognosis ? - No RT or RD on 360 scleral depressed exam ? - Reviewed s/s of RT/RD ? - Strict return precautions for any such RT/RD signs/symptoms ? - f/u in 3 months -- DFE/OCT ? ?3,4.Hypertensive retinopathy OU ?- discussed importance of tight BP control ?- monitor ? ?5. Pseudophakia OU ? - s/p CE/IOL (Dr. Herbert Deaner) ? - IOL in good position, doing well ? - monitor ? ?Ophthalmic Meds Ordered this visit:  ?No orders of the defined types were placed in this encounter. ?  ? ?Return in about 3 months (around 06/27/2021) for PVD OD, retinal hole OS - , Dilated Exam, OCT. ? ?There are no Patient Instructions on file for this visit. ? ? ?Explained the diagnoses, plan, and follow up with the patient and they expressed understanding.  Patient expressed understanding of the importance of proper follow up care.  ? ?This document serves as a record of services personally performed by Gardiner Sleeper, MD, PhD. It was created on their behalf by Orvan Falconer, an ophthalmic technician. The creation of this record is the provider's dictation and/or activities during the visit.    ? ?Electronically signed by: Orvan Falconer, OA, 03/31/21  8:38 PM ? ?This document serves as a record of services personally performed by Gardiner Sleeper, MD, PhD. It was created on their behalf by Donata Clay

## 2021-03-27 ENCOUNTER — Ambulatory Visit (INDEPENDENT_AMBULATORY_CARE_PROVIDER_SITE_OTHER): Payer: Medicare Other | Admitting: Ophthalmology

## 2021-03-27 ENCOUNTER — Other Ambulatory Visit: Payer: Self-pay

## 2021-03-27 ENCOUNTER — Encounter (INDEPENDENT_AMBULATORY_CARE_PROVIDER_SITE_OTHER): Payer: Self-pay | Admitting: Ophthalmology

## 2021-03-27 DIAGNOSIS — H33322 Round hole, left eye: Secondary | ICD-10-CM | POA: Diagnosis not present

## 2021-03-27 DIAGNOSIS — Z961 Presence of intraocular lens: Secondary | ICD-10-CM

## 2021-03-27 DIAGNOSIS — I10 Essential (primary) hypertension: Secondary | ICD-10-CM | POA: Diagnosis not present

## 2021-03-27 DIAGNOSIS — H35033 Hypertensive retinopathy, bilateral: Secondary | ICD-10-CM | POA: Diagnosis not present

## 2021-03-27 DIAGNOSIS — H43811 Vitreous degeneration, right eye: Secondary | ICD-10-CM | POA: Diagnosis not present

## 2021-06-12 NOTE — Progress Notes (Signed)
[facility] - Clinic Note  06/25/2021     CHIEF COMPLAINT Patient presents for Retina Follow Up   HISTORY OF PRESENT ILLNESS: Jade Green is a 71 y.o. female who presents to the clinic today for:   HPI     Retina Follow Up   Patient presents with  Other.  In left eye.  This started 3.  Duration of months.  I, the attending physician,  performed the HPI with the patient and updated documentation appropriately.        Comments   Patient here for 3 months retina follow up for Retina hole OS. Patient states vision doing ok. Still has floater in OD. Still annoying. OS no problem. No eye pain.      Last edited by Rennis Chris, MD on 06/26/2021  5:26 PM.    Pt states the floater in her right eye is still there and seems the same, pt is having blepharoplasty with Dr. Chestine Spore at [facility] on Monday  Referring physician: Isla Pence, OD [address] [place],  [place] [address]  HISTORICAL INFORMATION:   Selected notes from the MEDICAL RECORD NUMBER Referred by Dr. Clydene Pugh for concern of PVD / retinal hole OS LEE:  Ocular Hx- PMH-    CURRENT MEDICATIONS: No current outpatient medications on file. (Ophthalmic Drugs)   No current facility-administered medications for this visit. (Ophthalmic Drugs)   Current Outpatient Medications (Other)  Medication Sig   clonazePAM (KLONOPIN) 0.5 MG tablet Take 1 tablet (0.5 mg total) by mouth daily as needed for anxiety.   Cyanocobalamin 1000 MCG TBCR Take 1 tablet by mouth daily.    levothyroxine (SYNTHROID) 112 MCG tablet Take 1 tablet (112 mcg total) by mouth daily.   Multiple Vitamin (MULTIVITAMIN) capsule Take 1 capsule by mouth daily.    buPROPion (WELLBUTRIN XL) 300 MG 24 hr tablet Take 1 tablet (300 mg total) by mouth daily.   cetirizine (ZYRTEC) 10 MG tablet Take 10 mg by mouth daily.    varenicline (CHANTIX CONTINUING MONTH PAK) 1 MG tablet Take 1 tablet (1 mg total) by mouth 2 (two) times daily.   varenicline  (CHANTIX) 0.5 MG tablet Take 1 tablet (0.5 mg total) by mouth 2 (two) times daily. (Patient not taking: Reported on 06/25/2021)   No current facility-administered medications for this visit. (Other)   REVIEW OF SYSTEMS: ROS   Positive for: Gastrointestinal, Endocrine, Eyes, Psychiatric Negative for: Constitutional, Neurological, Skin, Genitourinary, Musculoskeletal, HENT, Cardiovascular, Respiratory, Allergic/Imm, Heme/Lymph Last edited by Laddie Aquas, COA on 06/25/2021  1:41 PM.     ALLERGIES Allergies  Allergen Reactions   Amoxicillin-Pot Clavulanate Nausea And Vomiting   Codeine     REACTION: Nausea   PAST MEDICAL HISTORY Past Medical History:  Diagnosis Date   Allergy    Anxiety    Depression    GERD (gastroesophageal reflux disease)    Hypertension    Panic attack    Panic disorder    Thyroid disease    Past Surgical History:  Procedure Laterality Date   APPENDECTOMY  04/20/2012   acute suppurative appendicitis. And a   CATARACT EXTRACTION, BILATERAL Bilateral    x2, May 2017 and July 2017   COLONOSCOPY  [id number]   TUBAL LIGATION  1983   FAMILY HISTORY Family History  Problem Relation Age of Onset   COPD Mother    Heart disease Maternal Grandmother    Cancer Paternal Grandfather    SOCIAL HISTORY Social History  Tobacco Use   Smoking status: Every Day    Packs/day: 0.50    Years: 20.00    Total pack years: 10.00    Types: Cigarettes   Smokeless tobacco: Never  Vaping Use   Vaping Use: Never used  Substance Use Topics   Alcohol use: Yes    Comment: on occasion   Drug use: No       OPHTHALMIC EXAM:  Base Eye Exam     Visual Acuity (Snellen - Linear)       Right Left   Dist [place] 20/20 20/20         Tonometry (Tonopen, 1:38 PM)       Right Left   Pressure 16 14         Pupils       Dark Light Shape React APD   Right 2 1 Round Brisk None   Left 2 1 Round Brisk None         Visual Fields (Counting fingers)       Left  Right    Full Full         Extraocular Movement       Right Left    Full, Ortho Full, Ortho         Neuro/Psych     Oriented x3: Yes   Mood/Affect: Normal         Dilation     Both eyes: 1.0% Mydriacyl, 2.5% Phenylephrine @ 1:38 PM           Slit Lamp and Fundus Exam     Slit Lamp Exam       Right Left   Lids/Lashes Dermatochalasis - upper lid Dermatochalasis - upper lid   Conjunctiva/Sclera White and quiet White and quiet   Cornea mild arcus, well healed cataract wound, trace tear film debris mild arcus, well healed cataract wound, mild tear film debris   Anterior Chamber Deep and quiet Deep and quiet   Iris Round and dilated Round and dilated   Lens PC IOL in good position PC IOL in good position   Anterior Vitreous Vitreous syneresis, Posterior vitreous detachment, vitreous condensations Vitreous syneresis, vitreous condensations         Fundus Exam       Right Left   Disc Pink and Sharp, Tilted, temporal PPA Pink and Sharp, Tilted, temporal PPA   C/D Ratio 0.55 0.5   Macula Flat, Blunted foveal reflex, mild RPE mottling, No heme or edema Flat, good foveal reflex, mild RPE mottling, No heme or edema   Vessels mild attenuation, mild tortuosity mild tortuosity, attenuated   Periphery Attached, No heme, No RT/RD Attached, tiny operculated hole at 0130 -- good laser changes surrounding, no SRF            IMAGING AND PROCEDURES  Imaging and Procedures for 06/25/2021  OCT, Retina - OU - Both Eyes       Right Eye Quality was good. Central Foveal Thickness: 282. Progression has been stable. Findings include normal foveal contour, no IRF, no SRF, myopic contour.   Left Eye Quality was good. Central Foveal Thickness: 296. Progression has been stable. Findings include normal foveal contour, no IRF, no SRF, myopic contour (Partial PVD).   Notes *Images captured and stored on drive  Diagnosis / Impression:  NFP; no IRF/SRF OU OD: PVD OS: partial  PVD   Clinical management:  See below  Abbreviations: NFP - Normal foveal profile. CME - cystoid macular edema. PED - pigment  epithelial detachment. IRF - intraretinal fluid. SRF - subretinal fluid. EZ - ellipsoid zone. ERM - epiretinal membrane. ORA - outer retinal atrophy. ORT - outer retinal tubulation. SRHM - subretinal hyper-reflective material. IRHM - intraretinal hyper-reflective material            ASSESSMENT/PLAN:    ICD-10-CM   1. Retinal hole of left eye  H33.322 OCT, Retina - OU - Both Eyes    2. Posterior vitreous detachment of right eye  H43.811 OCT, Retina - OU - Both Eyes    3. Essential hypertension  I10     4. Hypertensive retinopathy of both eyes  H35.033     5. Pseudophakia, both eyes  Z96.1      Retinal Hole OS   - small operculated hole at 0130 -- no SRF - s/p laser retinopexy OS (02.28.23) -- good laser changes surrounding - completed Lotemax QID OS x7 days - no new RT/RD - pt is cleared from a retina standpoint for release to Dr. Clydene Pugh and resumption of primary eye care - pt can f/u here prn  2. PVD / vitreous syneresis OD  - symptomatic floaters since the beginning of February -- improved  - Discussed findings and prognosis  - No RT or RD on 360 scleral depressed exam  - Reviewed s/s of RT/RD  - Strict return precautions for any such RT/RD signs/symptoms  3,4.Hypertensive retinopathy OU - discussed importance of tight BP control - monitor  5. Pseudophakia OU  - s/p CE/IOL (Dr. Elmer Picker)  - IOL in good position, doing well  - monitor  Ophthalmic Meds Ordered this visit:  No orders of the defined types were placed in this encounter.    Return if symptoms worsen or fail to improve.  There are no Patient Instructions on file for this visit.   Explained the diagnoses, plan, and follow up with the patient and they expressed understanding.  Patient expressed understanding of the importance of proper follow up care.   This document  serves as a record of services personally performed by Karie Chimera, MD, PhD. It was created on their behalf by Gerilyn Nestle, COT an ophthalmic technician. The creation of this record is the provider's dictation and/or activities during the visit.    Electronically signed by:  Gerilyn Nestle, COT 06/12/21 5:27 PM   This document serves as a record of services personally performed by Karie Chimera, MD, PhD. It was created on their behalf by Glee Arvin. Manson Passey, OA an ophthalmic technician. The creation of this record is the provider's dictation and/or activities during the visit.    Electronically signed by: Glee Arvin. Manson Passey, [place] 06.20.2023 5:27 PM  Karie Chimera, M.D., Ph.D. Diseases & Surgery of the Retina and Vitreous [facility]  I have reviewed the above documentation for accuracy and completeness, and I agree with the above. Karie Chimera, M.D., Ph.D. 06/26/21 5:27 PM  Abbreviations: M myopia (nearsighted); A astigmatism; H hyperopia (farsighted); P presbyopia; Mrx spectacle prescription;  CTL contact lenses; OD right eye; OS left eye; OU both eyes  XT exotropia; ET esotropia; PEK punctate epithelial keratitis; PEE punctate epithelial erosions; DES dry eye syndrome; MGD meibomian gland dysfunction; ATs artificial tears; PFAT's preservative free artificial tears; NSC nuclear sclerotic cataract; PSC posterior subcapsular cataract; ERM epi-retinal membrane; PVD posterior vitreous detachment; RD retinal detachment; DM diabetes mellitus; DR diabetic retinopathy; NPDR non-proliferative diabetic retinopathy; PDR proliferative diabetic retinopathy; CSME clinically significant macular edema; DME diabetic macular edema;  dbh dot blot hemorrhages; CWS cotton wool spot; POAG primary open angle glaucoma; C/D cup-to-disc ratio; HVF humphrey visual field; GVF goldmann visual field; OCT optical coherence tomography; IOP intraocular pressure; BRVO Branch retinal vein occlusion;  CRVO central retinal vein occlusion; CRAO central retinal artery occlusion; BRAO branch retinal artery occlusion; RT retinal tear; SB scleral buckle; PPV pars plana vitrectomy; VH Vitreous hemorrhage; PRP panretinal laser photocoagulation; IVK intravitreal kenalog; VMT vitreomacular traction; MH Macular hole;  NVD neovascularization of the disc; NVE neovascularization elsewhere; AREDS age related eye disease study; ARMD age related macular degeneration; POAG primary open angle glaucoma; EBMD epithelial/anterior basement membrane dystrophy; ACIOL anterior chamber intraocular lens; IOL intraocular lens; PCIOL posterior chamber intraocular lens; Phaco/IOL phacoemulsification with intraocular lens placement; PRK photorefractive keratectomy; LASIK laser assisted in situ keratomileusis; HTN hypertension; DM diabetes mellitus; COPD chronic obstructive pulmonary disease

## 2021-06-19 ENCOUNTER — Encounter (INDEPENDENT_AMBULATORY_CARE_PROVIDER_SITE_OTHER): Payer: Medicare Other | Admitting: Ophthalmology

## 2021-06-20 ENCOUNTER — Encounter (INDEPENDENT_AMBULATORY_CARE_PROVIDER_SITE_OTHER): Payer: Medicare Other | Admitting: Ophthalmology

## 2021-06-25 ENCOUNTER — Encounter (INDEPENDENT_AMBULATORY_CARE_PROVIDER_SITE_OTHER): Payer: Self-pay | Admitting: Ophthalmology

## 2021-06-25 ENCOUNTER — Ambulatory Visit (INDEPENDENT_AMBULATORY_CARE_PROVIDER_SITE_OTHER): Payer: Medicare Other | Admitting: Ophthalmology

## 2021-06-25 DIAGNOSIS — H43811 Vitreous degeneration, right eye: Secondary | ICD-10-CM | POA: Diagnosis not present

## 2021-06-25 DIAGNOSIS — H35033 Hypertensive retinopathy, bilateral: Secondary | ICD-10-CM | POA: Diagnosis not present

## 2021-06-25 DIAGNOSIS — I10 Essential (primary) hypertension: Secondary | ICD-10-CM | POA: Diagnosis not present

## 2021-06-25 DIAGNOSIS — Z961 Presence of intraocular lens: Secondary | ICD-10-CM

## 2021-06-25 DIAGNOSIS — H33322 Round hole, left eye: Secondary | ICD-10-CM

## 2021-06-26 ENCOUNTER — Encounter (INDEPENDENT_AMBULATORY_CARE_PROVIDER_SITE_OTHER): Payer: Self-pay | Admitting: Ophthalmology
